# Patient Record
Sex: Female | Born: 1990 | Race: White | Hispanic: Yes | Marital: Single | Smoking: Never smoker
Health system: Southern US, Community
[De-identification: ages and names within clinical notes are randomized; demographics above are authoritative.]

## PROBLEM LIST (undated history)

## (undated) DIAGNOSIS — K5792 Diverticulitis of intestine, part unspecified, without perforation or abscess without bleeding: Secondary | ICD-10-CM

## (undated) DIAGNOSIS — F32A Depression, unspecified: Secondary | ICD-10-CM

## (undated) HISTORY — PX: ADENOIDECTOMY: SUR15

---
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
## 2012-08-14 ENCOUNTER — Encounter (HOSPITAL_BASED_OUTPATIENT_CLINIC_OR_DEPARTMENT_OTHER): Payer: Self-pay | Admitting: Emergency Medicine

## 2012-08-14 ENCOUNTER — Emergency Department (HOSPITAL_BASED_OUTPATIENT_CLINIC_OR_DEPARTMENT_OTHER)
Admission: EM | Admit: 2012-08-14 | Discharge: 2012-08-14 | Disposition: A | Discharge status: Home / Self Care | Payer: Managed Care, Other (non HMO) | Attending: Emergency Medicine | Admitting: Emergency Medicine

## 2012-08-14 DIAGNOSIS — R5381 Other malaise: Secondary | ICD-10-CM | POA: Insufficient documentation

## 2012-08-14 DIAGNOSIS — Z3202 Encounter for pregnancy test, result negative: Secondary | ICD-10-CM | POA: Insufficient documentation

## 2012-08-14 DIAGNOSIS — R112 Nausea with vomiting, unspecified: Secondary | ICD-10-CM | POA: Insufficient documentation

## 2012-08-14 DIAGNOSIS — R197 Diarrhea, unspecified: Secondary | ICD-10-CM | POA: Insufficient documentation

## 2012-08-14 DIAGNOSIS — R109 Unspecified abdominal pain: Secondary | ICD-10-CM | POA: Insufficient documentation

## 2012-08-14 DIAGNOSIS — R42 Dizziness and giddiness: Secondary | ICD-10-CM | POA: Insufficient documentation

## 2012-08-14 DIAGNOSIS — R5383 Other fatigue: Secondary | ICD-10-CM | POA: Insufficient documentation

## 2012-08-14 LAB — URINALYSIS, ROUTINE W REFLEX MICROSCOPIC
Bilirubin Urine: NEGATIVE
Hgb urine dipstick: NEGATIVE
Protein, ur: NEGATIVE mg/dL
Urobilinogen, UA: 0.2 mg/dL (ref 0.0–1.0)

## 2012-08-14 LAB — BASIC METABOLIC PANEL
BUN: 16 mg/dL (ref 6–23)
Calcium: 9.4 mg/dL (ref 8.4–10.5)
GFR calc non Af Amer: >90 mL/min (ref >90)
Glucose, Bld: 139 mg/dL — ABNORMAL HIGH (ref 70–99)
Sodium: 138 mEq/L (ref 135–145)

## 2012-08-14 MED ORDER — SODIUM CHLORIDE 0.9 % IV BOLUS (SEPSIS)
1000.0000 mL | Freq: Once | INTRAVENOUS | Status: AC
  Administered 2012-08-14: 1000 mL via INTRAVENOUS

## 2012-08-14 MED ORDER — SODIUM CHLORIDE 0.9 % IV BOLUS (SEPSIS): 1000.0000 mL | Freq: Once | INTRAVENOUS | Status: DC

## 2012-08-14 MED ORDER — ONDANSETRON HCL 4 MG PO TABS: 8.0000 mg | ORAL_TABLET | Freq: Three times a day (TID) | ORAL | Status: DC | PRN

## 2012-08-14 MED ORDER — LOPERAMIDE HCL 2 MG PO CAPS
4.0000 mg | ORAL_CAPSULE | Freq: Once | ORAL | Status: AC
  Administered 2012-08-14: 4 mg via ORAL
  Filled 2012-08-14: qty 2

## 2012-08-14 MED ORDER — ONDANSETRON HCL 4 MG/2ML IJ SOLN
4.0000 mg | Freq: Once | INTRAMUSCULAR | Status: AC
  Administered 2012-08-14: 4 mg via INTRAVENOUS
  Filled 2012-08-14: qty 2

## 2012-08-14 NOTE — ED Notes (Signed)
Pt with vomiting and diarrhea since 10pm

## 2012-08-14 NOTE — ED Provider Notes (Signed)
History     CSN: [id number]  Arrival date & time 08/14/12  0404   First MD Initiated Contact with Patient 08/14/12 0434      Chief Complaint  Patient presents with  . Emesis  . Diarrhea    (Consider location/radiation/quality/duration/timing/severity/associated sxs/prior treatment) HPI Patient developed vomiting and diarrhea onset 10 PM on 08/13/2012. Accompanied by diffuse crampy mild abdominal pain, nonradiating. Denies blood directed and denies hematemesis denies fever no treatment prior to coming here. Other associated symptoms include lightheadedness. Nothing makes symptoms better or worse History reviewed. No pertinent past medical history. Past medical history negative Past Surgical History  Procedure Date  . Adenoidectomy     No family history on file.  History  Substance Use Topics  . Smoking status: Never Smoker   . Smokeless tobacco: Not on file  . Alcohol Use: No   no drug use  OB History    Grav Para Term Preterm Abortions TAB SAB Ect Mult Living                  Review of Systems  HENT: Negative.   Respiratory: Negative.   Cardiovascular: Negative.   Gastrointestinal: Positive for nausea, vomiting, abdominal pain and diarrhea.  Musculoskeletal: Negative.   Skin: Negative.   Neurological: Positive for weakness.  Hematological: Negative.   Psychiatric/Behavioral: Negative.   All other systems reviewed and are negative.    Allergies  Review of patient's allergies indicates no known allergies.  Home Medications  No current outpatient prescriptions on file.  BP 109/61  Pulse 120  Temp 97.9 F (36.6 C) (Oral)  Resp 18  Ht 4\' 11"  (1.499 m)  Wt 150 lb (68.04 kg)  BMI 30.30 kg/m2  SpO2 98%  Physical Exam  Nursing note and vitals reviewed. Constitutional: She appears well-developed and well-nourished. No distress.  HENT:  Head: Normocephalic and atraumatic.       Mucous membranes dry  Eyes: Conjunctivae normal are normal. Pupils are  equal, round, and reactive to light.  Neck: Neck supple. No tracheal deviation present. No thyromegaly present.  Cardiovascular: Regular rhythm.   No murmur heard.      Mildly tachycardic  Pulmonary/Chest: Effort normal and breath sounds normal.  Abdominal: Soft. Bowel sounds are normal. She exhibits no distension. There is no tenderness.  Musculoskeletal: Normal range of motion. She exhibits no edema and no tenderness.  Neurological: She is alert. Coordination normal.  Skin: Skin is warm and dry. No rash noted.  Psychiatric: She has a normal mood and affect.    ED Course  Procedures (including critical care time)  Labs Reviewed - No data to display No results found.   No diagnosis found. . Results for orders placed during the hospital encounter of 08/14/12  BASIC METABOLIC PANEL      Component Value Range   Sodium 138  135 - 145 mEq/L   Potassium 4.1  3.5 - 5.1 mEq/L   Chloride 101  96 - 112 mEq/L   CO2 24  19 - 32 mEq/L   Glucose, Bld 139 (*) 70 - 99 mg/dL   BUN 16  6 - 23 mg/dL   Creatinine, Ser 4.09  0.50 - 1.10 mg/dL   Calcium 9.4  8.4 - 81.1 mg/dL   GFR calc non Af Amer >90  >90 mL/min   GFR calc Af Amer >90  >90 mL/min  URINALYSIS, ROUTINE W REFLEX MICROSCOPIC      Component Value Range   Color, Urine YELLOW  YELLOW  APPearance CLEAR  CLEAR   Specific Gravity, Urine 1.030  1.005 - 1.030   pH 5.5  5.0 - 8.0   Glucose, UA NEGATIVE  NEGATIVE mg/dL   Hgb urine dipstick NEGATIVE  NEGATIVE   Bilirubin Urine NEGATIVE  NEGATIVE   Ketones, ur 40 (*) NEGATIVE mg/dL   Protein, ur NEGATIVE  NEGATIVE mg/dL   Urobilinogen, UA 0.2  0.0 - 1.0 mg/dL   Nitrite NEGATIVE  NEGATIVE   Leukocytes, UA NEGATIVE  NEGATIVE  PREGNANCY, URINE      Component Value Range   Preg Test, Ur NEGATIVE  NEGATIVE   No results found.  5:23 AM feels improved after treatment with intravenous fluids and Zofran. Drinking water presently. 6: 40 5 AM feels improved radial home.  MDM  Strongly  suspect gastroenteritis Plan prescription for Zofran, Imodium, followup with PMD Suggests hemoglobin A1c Diagnosis #1 nausea vomiting diarrhea #2 hyperglycemia #3 mild dehydration      Doug Sou, MD 08/14/12 [phone]

## 2012-08-14 NOTE — ED Notes (Signed)
Pt still unable to provide urine sample. Pt given water per request.

## 2021-03-23 ENCOUNTER — Encounter (HOSPITAL_BASED_OUTPATIENT_CLINIC_OR_DEPARTMENT_OTHER): Payer: Self-pay | Admitting: *Deleted

## 2021-03-23 ENCOUNTER — Other Ambulatory Visit: Payer: Self-pay

## 2021-03-23 ENCOUNTER — Emergency Department (HOSPITAL_BASED_OUTPATIENT_CLINIC_OR_DEPARTMENT_OTHER): Payer: Self-pay

## 2021-03-23 ENCOUNTER — Emergency Department (HOSPITAL_BASED_OUTPATIENT_CLINIC_OR_DEPARTMENT_OTHER)
Admission: EM | Admit: 2021-03-23 | Discharge: 2021-03-23 | Disposition: A | Discharge status: Home / Self Care | Payer: Self-pay | Attending: Emergency Medicine | Admitting: Emergency Medicine

## 2021-03-23 DIAGNOSIS — R000 Tachycardia, unspecified: Secondary | ICD-10-CM | POA: Insufficient documentation

## 2021-03-23 DIAGNOSIS — K5792 Diverticulitis of intestine, part unspecified, without perforation or abscess without bleeding: Secondary | ICD-10-CM | POA: Insufficient documentation

## 2021-03-23 DIAGNOSIS — B9689 Other specified bacterial agents as the cause of diseases classified elsewhere: Secondary | ICD-10-CM | POA: Insufficient documentation

## 2021-03-23 DIAGNOSIS — U071 COVID-19: Secondary | ICD-10-CM | POA: Insufficient documentation

## 2021-03-23 HISTORY — DX: Depression, unspecified: F32.A

## 2021-03-23 LAB — COMPREHENSIVE METABOLIC PANEL
ALT: 106 U/L — ABNORMAL HIGH (ref 0–44)
AST: 49 U/L — ABNORMAL HIGH (ref 15–41)
Albumin: 3.7 g/dL (ref 3.5–5.0)
Alkaline Phosphatase: 70 U/L (ref 38–126)
Anion gap: 9 (ref 5–15)
BUN: 7 mg/dL (ref 6–20)
CO2: 26 mmol/L (ref 22–32)
Calcium: 8.8 mg/dL — ABNORMAL LOW (ref 8.9–10.3)
Chloride: 100 mmol/L (ref 98–111)
Creatinine, Ser: 0.73 mg/dL (ref 0.44–1.00)
GFR, Estimated: >60 mL/min (ref >60)
Glucose, Bld: 103 mg/dL — ABNORMAL HIGH (ref 70–99)
Potassium: 3.6 mmol/L (ref 3.5–5.1)
Sodium: 135 mmol/L (ref 135–145)
Total Bilirubin: 0.7 mg/dL (ref 0.3–1.2)
Total Protein: 7.4 g/dL (ref 6.5–8.1)

## 2021-03-23 LAB — URINALYSIS, ROUTINE W REFLEX MICROSCOPIC
Bilirubin Urine: NEGATIVE
Glucose, UA: NEGATIVE mg/dL
Ketones, ur: 15 mg/dL — AB
Leukocytes,Ua: NEGATIVE
Nitrite: NEGATIVE
Protein, ur: NEGATIVE mg/dL
Specific Gravity, Urine: 1.01 (ref 1.005–1.030)
pH: 6.5 (ref 5.0–8.0)

## 2021-03-23 LAB — CBC WITH DIFFERENTIAL/PLATELET
Abs Immature Granulocytes: 0.05 10*3/uL (ref 0.00–0.07)
Basophils Absolute: 0 10*3/uL (ref 0.0–0.1)
Basophils Relative: 0 %
Eosinophils Absolute: 0 10*3/uL (ref 0.0–0.5)
Eosinophils Relative: 0 %
HCT: 37.6 % (ref 36.0–46.0)
Hemoglobin: 12.9 g/dL (ref 12.0–15.0)
Immature Granulocytes: 0 %
Lymphocytes Relative: 10 %
Lymphs Abs: 1.2 10*3/uL (ref 0.7–4.0)
MCH: 29.7 pg (ref 26.0–34.0)
MCHC: 34.3 g/dL (ref 30.0–36.0)
MCV: 86.4 fL (ref 80.0–100.0)
Monocytes Absolute: 0.6 10*3/uL (ref 0.1–1.0)
Monocytes Relative: 5 %
Neutro Abs: 10.5 10*3/uL — ABNORMAL HIGH (ref 1.7–7.7)
Neutrophils Relative %: 85 %
Platelets: 246 10*3/uL (ref 150–400)
RBC: 4.35 MIL/uL (ref 3.87–5.11)
RDW: 11.7 % (ref 11.5–15.5)
WBC: 12.5 10*3/uL — ABNORMAL HIGH (ref 4.0–10.5)
nRBC: 0 % (ref 0.0–0.2)

## 2021-03-23 LAB — URINALYSIS, MICROSCOPIC (REFLEX)

## 2021-03-23 LAB — LIPASE, BLOOD: Lipase: 23 U/L (ref 11–51)

## 2021-03-23 LAB — D-DIMER, QUANTITATIVE: D-Dimer, Quant: 0.76 ug/mL-FEU — ABNORMAL HIGH (ref 0.00–0.50)

## 2021-03-23 LAB — PREGNANCY, URINE: Preg Test, Ur: NEGATIVE

## 2021-03-23 MED ORDER — CIPROFLOXACIN HCL 500 MG PO TABS: 500.0000 mg | ORAL_TABLET | Freq: Two times a day (BID) | ORAL | 0 refills | Status: AC

## 2021-03-23 MED ORDER — IOHEXOL 350 MG/ML SOLN
100.0000 mL | Freq: Once | INTRAVENOUS | Status: AC | PRN
  Administered 2021-03-23: 100 mL via INTRAVENOUS

## 2021-03-23 MED ORDER — METRONIDAZOLE 500 MG PO TABS
500.0000 mg | ORAL_TABLET | Freq: Once | ORAL | Status: AC
  Administered 2021-03-23: 500 mg via ORAL
  Filled 2021-03-23: qty 1

## 2021-03-23 MED ORDER — FENTANYL CITRATE (PF) 100 MCG/2ML IJ SOLN
50.0000 ug | Freq: Once | INTRAMUSCULAR | Status: DC
  Filled 2021-03-23: qty 2

## 2021-03-23 MED ORDER — ONDANSETRON HCL 4 MG/2ML IJ SOLN
4.0000 mg | Freq: Once | INTRAMUSCULAR | Status: AC
  Administered 2021-03-23: 4 mg via INTRAVENOUS
  Filled 2021-03-23: qty 2

## 2021-03-23 MED ORDER — KETOROLAC TROMETHAMINE 15 MG/ML IJ SOLN
15.0000 mg | Freq: Once | INTRAMUSCULAR | Status: AC
  Administered 2021-03-23: 15 mg via INTRAVENOUS
  Filled 2021-03-23: qty 1

## 2021-03-23 MED ORDER — CIPROFLOXACIN HCL 500 MG PO TABS
500.0000 mg | ORAL_TABLET | Freq: Once | ORAL | Status: AC
  Administered 2021-03-23: 500 mg via ORAL
  Filled 2021-03-23: qty 1

## 2021-03-23 MED ORDER — SODIUM CHLORIDE 0.9 % IV BOLUS
500.0000 mL | Freq: Once | INTRAVENOUS | Status: AC
  Administered 2021-03-23: 500 mL via INTRAVENOUS

## 2021-03-23 MED ORDER — METRONIDAZOLE 500 MG PO TABS: 500.0000 mg | ORAL_TABLET | Freq: Three times a day (TID) | ORAL | 0 refills | Status: AC

## 2021-03-23 NOTE — ED Triage Notes (Signed)
Covid + x 9 days , left flank pain x 2 days

## 2021-03-23 NOTE — ED Provider Notes (Signed)
[facility] EMERGENCY DEPARTMENT Provider Note   CSN: [id number] Arrival date & time: 03/23/21  0035     History Chief Complaint  Patient presents with   Covid Positive    Wanda Braun is a 30 y.o. female.  The history is provided by the patient.  Wanda Braun is a 30 y.o. female who presents to the Emergency Department complaining of abdominal pain.  She presents to the ED complaining of LUQ abdominal pain that started two days ago.  Pain is sharp in nature, waxes and wanes.  Thought she was constipated so she took magnesium with resultant diarrhea but no improvement in pain.  Pain is worse with breathing and walking.    No fever, sob.  Has mild cough.  Has mild nausea.  No vomiting.  No dysuria.    Had covid 19 a week ago Tuesday.    Has no known medical problems and takes no medications.  No prior similar sxs.      Past Medical History:  Diagnosis Date   Depression     There are no problems to display for this patient.   Past Surgical History:  Procedure Laterality Date   ADENOIDECTOMY       OB History   No obstetric history on file.     No family history on file.  Social History   Tobacco Use   Smoking status: Never  Substance Use Topics   Alcohol use: No   Drug use: No    Home Medications Prior to Admission medications   Medication Sig Start Date End Date Taking? Authorizing Provider  ciprofloxacin (CIPRO) 500 MG tablet Take 1 tablet (500 mg total) by mouth 2 (two) times daily. 03/23/21  Yes Tilden Fossa, MD  metroNIDAZOLE (FLAGYL) 500 MG tablet Take 1 tablet (500 mg total) by mouth 3 (three) times daily. 03/23/21  Yes Tilden Fossa, MD  ondansetron (ZOFRAN) 4 MG tablet Take 2 tablets (8 mg total) by mouth every 8 (eight) hours as needed for nausea. 08/14/12   Doug Sou, MD    Allergies    Fish allergy, Shellfish allergy, and Penicillins  Review of Systems   Review of Systems  All other systems reviewed and are  negative.  Physical Exam Updated Vital Signs BP 122/88 (BP Location: Left Arm)   Pulse 98   Temp 99.2 F (37.3 C) (Oral)   Resp 16   Ht 4\' 11"  (1.499 m)   Wt 86.2 kg   LMP 03/15/2021   SpO2 97%   BMI 38.38 kg/m   Physical Exam Vitals and nursing note reviewed.  Constitutional:      Appearance: She is well-developed.  HENT:     Head: Normocephalic and atraumatic.  Cardiovascular:     Rate and Rhythm: Regular rhythm. Tachycardia present.     Heart sounds: No murmur heard. Pulmonary:     Effort: Pulmonary effort is normal. No respiratory distress.     Breath sounds: Normal breath sounds.  Abdominal:     Palpations: Abdomen is soft.     Tenderness: There is abdominal tenderness. There is no guarding or rebound.     Comments: Moderate LUQ tenderness  Musculoskeletal:        General: No swelling or tenderness.  Skin:    General: Skin is warm and dry.  Neurological:     Mental Status: She is alert and oriented to person, place, and time.  Psychiatric:        Behavior: Behavior normal.  ED Results / Procedures / Treatments   Labs (all labs ordered are listed, but only abnormal results are displayed) Labs Reviewed  URINALYSIS, ROUTINE W REFLEX MICROSCOPIC - Abnormal; Notable for the following components:      Result Value   Hgb urine dipstick TRACE (*)    Ketones, ur 15 (*)    All other components within normal limits  URINALYSIS, MICROSCOPIC (REFLEX) - Abnormal; Notable for the following components:   Bacteria, UA RARE (*)    All other components within normal limits  COMPREHENSIVE METABOLIC PANEL - Abnormal; Notable for the following components:   Glucose, Bld 103 (*)    Calcium 8.8 (*)    AST 49 (*)    ALT 106 (*)    All other components within normal limits  CBC WITH DIFFERENTIAL/PLATELET - Abnormal; Notable for the following components:   WBC 12.5 (*)    Neutro Abs 10.5 (*)    All other components within normal limits  D-DIMER, QUANTITATIVE - Abnormal;  Notable for the following components:   D-Dimer, Quant 0.76 (*)    All other components within normal limits  PREGNANCY, URINE  LIPASE, BLOOD    EKG None  Radiology CT Angio Chest PE W/Cm &/Or Wo Cm  Result Date: 03/23/2021 CLINICAL DATA:  Positive D-dimer and COVID positive 9 days ago. Left flank pain for 2 days EXAM: CT ANGIOGRAPHY CHEST CT ABDOMEN AND PELVIS WITH CONTRAST TECHNIQUE: Multidetector CT imaging of the chest was performed using the standard protocol during bolus administration of intravenous contrast. Multiplanar CT image reconstructions and MIPs were obtained to evaluate the vascular anatomy. Multidetector CT imaging of the abdomen and pelvis was performed using the standard protocol during bolus administration of intravenous contrast. CONTRAST:  OMNIPAQUE IOHEXOL 350 MG/ML SOLN COMPARISON:  None. FINDINGS: CTA CHEST FINDINGS Cardiovascular: Satisfactory opacification of the pulmonary arteries to the segmental level. No evidence of pulmonary embolism. Thin section reformats are available. Normal heart size. No pericardial effusion. Mediastinum/Nodes: Negative for adenopathy or mass. Lungs/Pleura: Mild mosaic attenuation of the lungs, usually from air trapping. There is no edema, consolidation, effusion, or pneumothorax. Musculoskeletal: Negative Review of the MIP images confirms the above findings. CT ABDOMEN and PELVIS FINDINGS Hepatobiliary: Multiple hypervascular liver masses on chest CTA which are very subtle on portal venous phase. These are marked on series 4 of the chest CTA. Two masses in the left lobe liver, the larger measuring 3 cm on 4:62. On the same series image 66, 4 lesions are highlighted and measure up to 19 mm. An even smaller inferior and posterior right lobe lesion is seen on 4:87. No signs of cirrhosis or reported history of malignancy. No evidence of biliary obstruction or stone. Pancreas: Unremarkable. Spleen: Unremarkable. Adrenals/Urinary Tract: Negative  adrenals. No hydronephrosis or stone. Unremarkable bladder. Stomach/Bowel: Focal colonic wall thickening with adjacent fat inflammation at the level of a thickened diverticulum of the descending segment. Colonic diverticulosis is moderate at the level of the descending segment when considering age. Vascular/Lymphatic: No acute vascular abnormality. No mass or adenopathy. Reproductive:Negative Other: No ascites or pneumoperitoneum. Musculoskeletal: No acute abnormalities. Review of the MIP images confirms the above findings. IMPRESSION: Chest CTA: No acute finding. Abdominal CT: 1. Descending colonic diverticulitis. No abscess or pneumoperitoneum. 2. 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 Recommend outpatient enhanced liver MRI. Electronically Signed   By: Marnee Spring M.D.   On: 03/23/2021 04:51   CT Abdomen Pelvis W Contrast  Result Date: 03/23/2021 CLINICAL DATA:  Positive D-dimer and COVID positive 9 days ago. Left flank pain for 2 days EXAM: CT ANGIOGRAPHY CHEST CT ABDOMEN AND PELVIS WITH CONTRAST TECHNIQUE: Multidetector CT imaging of the chest was performed using the standard protocol during bolus administration of intravenous contrast. Multiplanar CT image reconstructions and MIPs were obtained to evaluate the vascular anatomy. Multidetector CT imaging of the abdomen and pelvis was performed using the standard protocol during bolus administration of intravenous contrast. CONTRAST:  100mL OMNIPAQUE IOHEXOL 350 MG/ML SOLN COMPARISON:  None. FINDINGS: CTA CHEST FINDINGS Cardiovascular: Satisfactory opacification of the pulmonary arteries to the segmental level. No evidence of pulmonary embolism. Thin section reformats are available. Normal heart size. No pericardial effusion. Mediastinum/Nodes: Negative for adenopathy or mass. Lungs/Pleura: Mild mosaic attenuation of the lungs, usually from air trapping. There is no edema, consolidation, effusion, or  pneumothorax. Musculoskeletal: Negative Review of the MIP images confirms the above findings. CT ABDOMEN and PELVIS FINDINGS Hepatobiliary: Multiple hypervascular liver masses on chest CTA which are very subtle on portal venous phase. These are marked on series 4 of the chest CTA. Two masses in the left lobe liver, the larger measuring 3 cm on 4:62. On the same series image 66, 4 lesions are highlighted and measure up to 19 mm. An even smaller inferior and posterior right lobe lesion is seen on 4:87. No signs of cirrhosis or reported history of malignancy. No evidence of biliary obstruction or stone. Pancreas: Unremarkable. Spleen: Unremarkable. Adrenals/Urinary Tract: Negative adrenals. No hydronephrosis or stone. Unremarkable bladder. Stomach/Bowel: Focal colonic wall thickening with adjacent fat inflammation at the level of a thickened diverticulum of the descending segment. Colonic diverticulosis is moderate at the level of the descending segment when considering age. Vascular/Lymphatic: No acute vascular abnormality. No mass or adenopathy. Reproductive:Negative Other: No ascites or pneumoperitoneum. Musculoskeletal: No acute abnormalities. Review of the MIP images confirms the above findings. IMPRESSION: Chest CTA: No acute finding. Abdominal CT: 1. Descending colonic diverticulitis. No abscess or pneumoperitoneum. 2. 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 Recommend outpatient enhanced liver MRI. Electronically Signed   By: Marnee SpringJonathon  Watts M.D.   On: 03/23/2021 04:51    Procedures Procedures   Medications Ordered in ED Medications  fentaNYL (SUBLIMAZE) injection 50 mcg (50 mcg Intravenous Not Given 03/23/21 0259)  ciprofloxacin (CIPRO) tablet 500 mg (has no administration in time range)  metroNIDAZOLE (FLAGYL) tablet 500 mg (has no administration in time range)  ketorolac (TORADOL) 15 MG/ML injection 15 mg (has no administration in time range)   ondansetron (ZOFRAN) injection 4 mg (4 mg Intravenous Given 03/23/21 0303)  sodium chloride 0.9 % bolus 500 mL ( Intravenous Stopped 03/23/21 0404)  iohexol (OMNIPAQUE) 350 MG/ML injection 100 mL (100 mLs Intravenous Contrast Given 03/23/21 0410)    ED Course  I have reviewed the triage vital signs and the nursing notes.  Pertinent labs & imaging results that were available during my care of the patient were reviewed by me and considered in my medical decision making (see chart for details).    MDM Rules/Calculators/A&P                          here for evaluation of two days of left upper quadrant pain, diarrhea. She did recently have a COVID-19 infection. She is tachycardic on ED presentation with significant abdominal tenderness. There is a pleuritic component to her pain and a D dimer was  obtained. D dimer was elevated and a CT PE study as well as a CT abdomen pelvis were obtained. Imaging is significant for acute diverticulitis without evidence of complication. Discussed with patient findings of studies. Will start antibiotics. She has a penicillin allergy, therefore we will treat with ciprofloxacin and Flagyl. Discussed outpatient follow-up and return precautions.  Final Clinical Impression(s) / ED Diagnoses Final diagnoses:  Acute diverticulitis    Rx / DC Orders ED Discharge Orders          Ordered    ciprofloxacin (CIPRO) 500 MG tablet  2 times daily        03/23/21 0500    metroNIDAZOLE (FLAGYL) 500 MG tablet  3 times daily        03/23/21 0500             Tilden Fossa, MD 03/23/21 [phone]

## 2022-02-08 ENCOUNTER — Emergency Department (HOSPITAL_BASED_OUTPATIENT_CLINIC_OR_DEPARTMENT_OTHER): Admission: EM | Admit: 2022-02-08 | Discharge: 2022-02-08 | Discharge status: Against Medical Advice | Payer: Self-pay

## 2022-02-08 ENCOUNTER — Other Ambulatory Visit: Payer: Self-pay

## 2022-10-19 ENCOUNTER — Other Ambulatory Visit: Payer: Self-pay

## 2022-10-19 ENCOUNTER — Emergency Department (HOSPITAL_BASED_OUTPATIENT_CLINIC_OR_DEPARTMENT_OTHER): Payer: No Typology Code available for payment source

## 2022-10-19 ENCOUNTER — Encounter (HOSPITAL_BASED_OUTPATIENT_CLINIC_OR_DEPARTMENT_OTHER): Payer: Self-pay

## 2022-10-19 ENCOUNTER — Emergency Department (HOSPITAL_BASED_OUTPATIENT_CLINIC_OR_DEPARTMENT_OTHER)
Admission: EM | Admit: 2022-10-19 | Discharge: 2022-10-19 | Disposition: A | Discharge status: Home / Self Care | Payer: No Typology Code available for payment source | Attending: Emergency Medicine | Admitting: Emergency Medicine

## 2022-10-19 DIAGNOSIS — K5732 Diverticulitis of large intestine without perforation or abscess without bleeding: Secondary | ICD-10-CM | POA: Insufficient documentation

## 2022-10-19 DIAGNOSIS — R000 Tachycardia, unspecified: Secondary | ICD-10-CM | POA: Insufficient documentation

## 2022-10-19 DIAGNOSIS — D72829 Elevated white blood cell count, unspecified: Secondary | ICD-10-CM | POA: Insufficient documentation

## 2022-10-19 DIAGNOSIS — R109 Unspecified abdominal pain: Secondary | ICD-10-CM | POA: Diagnosis present

## 2022-10-19 HISTORY — DX: Diverticulitis of intestine, part unspecified, without perforation or abscess without bleeding: K57.92

## 2022-10-19 LAB — CBC
HCT: 39.6 % (ref 36.0–46.0)
Hemoglobin: 13.5 g/dL (ref 12.0–15.0)
MCH: 29.5 pg (ref 26.0–34.0)
MCHC: 34.1 g/dL (ref 30.0–36.0)
MCV: 86.5 fL (ref 80.0–100.0)
Platelets: 338 10*3/uL (ref 150–400)
RBC: 4.58 MIL/uL (ref 3.87–5.11)
RDW: 11.9 % (ref 11.5–15.5)
WBC: 11.6 10*3/uL — ABNORMAL HIGH (ref 4.0–10.5)
nRBC: 0 % (ref 0.0–0.2)

## 2022-10-19 LAB — COMPREHENSIVE METABOLIC PANEL
ALT: 33 U/L (ref 0–44)
AST: 20 U/L (ref 15–41)
Albumin: 4.1 g/dL (ref 3.5–5.0)
Alkaline Phosphatase: 75 U/L (ref 38–126)
Anion gap: 10 (ref 5–15)
BUN: 9 mg/dL (ref 6–20)
CO2: 27 mmol/L (ref 22–32)
Calcium: 9.6 mg/dL (ref 8.9–10.3)
Chloride: 97 mmol/L — ABNORMAL LOW (ref 98–111)
Creatinine, Ser: 0.72 mg/dL (ref 0.44–1.00)
GFR, Estimated: >60 mL/min (ref >60)
Glucose, Bld: 98 mg/dL (ref 70–99)
Potassium: 3.9 mmol/L (ref 3.5–5.1)
Sodium: 134 mmol/L — ABNORMAL LOW (ref 135–145)
Total Bilirubin: 1.3 mg/dL — ABNORMAL HIGH (ref 0.3–1.2)
Total Protein: 8.3 g/dL — ABNORMAL HIGH (ref 6.5–8.1)

## 2022-10-19 LAB — URINALYSIS, ROUTINE W REFLEX MICROSCOPIC
Bilirubin Urine: NEGATIVE
Glucose, UA: NEGATIVE mg/dL
Ketones, ur: NEGATIVE mg/dL
Leukocytes,Ua: NEGATIVE
Nitrite: NEGATIVE
Protein, ur: NEGATIVE mg/dL
Specific Gravity, Urine: <=1.005 (ref 1.005–1.030)
pH: 6.5 (ref 5.0–8.0)

## 2022-10-19 LAB — PREGNANCY, URINE: Preg Test, Ur: NEGATIVE

## 2022-10-19 LAB — URINALYSIS, MICROSCOPIC (REFLEX)

## 2022-10-19 LAB — LIPASE, BLOOD: Lipase: 23 U/L (ref 11–51)

## 2022-10-19 MED ORDER — CIPROFLOXACIN HCL 500 MG PO TABS: 500.0000 mg | ORAL_TABLET | Freq: Two times a day (BID) | ORAL | 0 refills | Status: AC

## 2022-10-19 MED ORDER — METRONIDAZOLE 500 MG PO TABS: 500.0000 mg | ORAL_TABLET | Freq: Four times a day (QID) | ORAL | 0 refills | Status: AC

## 2022-10-19 MED ORDER — METRONIDAZOLE 500 MG PO TABS
500.0000 mg | ORAL_TABLET | Freq: Once | ORAL | Status: AC
  Administered 2022-10-19: 500 mg via ORAL
  Filled 2022-10-19: qty 1

## 2022-10-19 MED ORDER — CIPROFLOXACIN HCL 500 MG PO TABS
500.0000 mg | ORAL_TABLET | Freq: Once | ORAL | Status: AC
  Administered 2022-10-19: 500 mg via ORAL
  Filled 2022-10-19: qty 1

## 2022-10-19 NOTE — Discharge Instructions (Addendum)
You were seen in the ER for abdominal pain and diagnosed with acute sigmoid diverticulitis. Thankfully this appears to be well isolated and uncomplicated. A dose of antibiotic for this was given in the ER prior to discharge. A prescription for 5 days of two antibiotics were sent to your pharmacy as well which you should take in its entirety.  Come back into the ER if you develop severe nausea/vomiting, a high fever, or do not improve with the antibiotic therapy that you have been prescribed.  I have also attached information for [facility] as you did not have a primary care provider noted on your chart. If you have a primary care provider in place, please plan to follow up with them in the next week or so.

## 2022-10-19 NOTE — ED Notes (Signed)
Discharge paperwork reviewed entirely with patient, including Rx's and follow up care. Pain was under control. Pt verbalized understanding as well as all parties involved. No questions or concerns voiced at the time of discharge. No acute distress noted.   Pt ambulated out to PVA without incident or assistance.  

## 2022-10-19 NOTE — ED Triage Notes (Addendum)
Pt presents with complaint of sharp LLQ abdominal pain and pelvic pain . Pt with  constipation x 1 day. Hx of diverticulitis and concerned it may be a flare up

## 2022-10-19 NOTE — ED Provider Notes (Signed)
[facility] Provider Note   CSN: [id number] Arrival date & time: 10/19/22  1406     History Chief Complaint  Patient presents with   Abdominal Pain    Wanda Braun is a 32 y.o. female.  Patient with past history significant for recent viral URI, bacterial conjunctivitis of the left eye presents to the emergency department for complaints of abdominal pain.  She reports has been experiencing significant abdominal pain for the last 2 days on her left side.  She reports that this pain is somewhat consistent and she is concerned as she has a prior history of diverticulitis/diverticulosis.  Patient reports that she typically will be placed on antibiotics if she has diverticulitis.  Patient denies any fevers at this time but feels that she is warm and sweating.  Patient denies any significant nausea, vomiting, diarrhea.  Does report that she has a lack of appetite due to the significant abdominal pain.  Patient is currently being treated for the bacterial conjunctivitis with antibiotic drops.  At this time patient does not believe that she could be pregnant.  She reports that she has some lower abdominal cramping when she tries to urinate but denies any obvious dysuria, hematuria, increased urinary frequency or urgency.   Abdominal Pain Associated symptoms: constipation        Home Medications Prior to Admission medications   Medication Sig Start Date End Date Taking? Authorizing Provider  ciprofloxacin (CIPRO) 500 MG tablet Take 1 tablet (500 mg total) by mouth every 12 (twelve) hours. 10/19/22  Yes Luvenia Heller, PA-C  metroNIDAZOLE (FLAGYL) 500 MG tablet Take 1 tablet (500 mg total) by mouth 4 (four) times daily. 10/19/22  Yes Luvenia Heller, PA-C  ciprofloxacin (CIPRO) 500 MG tablet Take 1 tablet (500 mg total) by mouth 2 (two) times daily. 03/23/21   Quintella Reichert, MD  metroNIDAZOLE (FLAGYL) 500 MG tablet Take 1 tablet (500 mg total) by mouth 3  (three) times daily. 03/23/21   Quintella Reichert, MD  ondansetron (ZOFRAN) 4 MG tablet Take 2 tablets (8 mg total) by mouth every 8 (eight) hours as needed for nausea. 08/14/12   Orlie Dakin, MD      Allergies    Fish allergy, Shellfish allergy, and Penicillins    Review of Systems   Review of Systems  Gastrointestinal:  Positive for abdominal pain and constipation.  All other systems reviewed and are negative.   Physical Exam Updated Vital Signs BP 129/81 (BP Location: Left Arm)   Pulse 97   Temp 98.5 F (36.9 C) (Oral)   Resp 18   Ht '4\' 11"'$  (1.499 m)   Wt 90.7 kg   LMP 09/22/2022   SpO2 99%   BMI 40.40 kg/m  Physical Exam Vitals and nursing note reviewed.  Constitutional:      Appearance: She is well-developed.  HENT:     Head: Normocephalic and atraumatic.  Cardiovascular:     Rate and Rhythm: Regular rhythm. Tachycardia present.     Heart sounds: Normal heart sounds.  Pulmonary:     Effort: Pulmonary effort is normal.     Breath sounds: Normal breath sounds. No wheezing.  Abdominal:     General: Abdomen is flat. Bowel sounds are normal. There is no distension.     Palpations: Abdomen is soft. There is no mass.     Tenderness: There is abdominal tenderness. There is no right CVA tenderness or left CVA tenderness.     Comments:  Left-sided abdominal tenderness not focal to left upper quadrant or left lower quadrant.  Skin:    Capillary Refill: Capillary refill takes less than 2 seconds.  Neurological:     General: No focal deficit present.     Mental Status: She is alert.     ED Results / Procedures / Treatments   Labs (all labs ordered are listed, but only abnormal results are displayed) Labs Reviewed  COMPREHENSIVE METABOLIC PANEL - Abnormal; Notable for the following components:      Result Value   Sodium 134 (*)    Chloride 97 (*)    Total Protein 8.3 (*)    Total Bilirubin 1.3 (*)    All other components within normal limits  CBC - Abnormal;  Notable for the following components:   WBC 11.6 (*)    All other components within normal limits  URINALYSIS, ROUTINE W REFLEX MICROSCOPIC - Abnormal; Notable for the following components:   Hgb urine dipstick TRACE (*)    All other components within normal limits  URINALYSIS, MICROSCOPIC (REFLEX) - Abnormal; Notable for the following components:   Bacteria, UA RARE (*)    All other components within normal limits  LIPASE, BLOOD  PREGNANCY, URINE    EKG None  Radiology CT ABDOMEN PELVIS WO CONTRAST  Result Date: 10/19/2022 CLINICAL DATA:  Sharp left lower quadrant abdominal and pelvic pain. History of diverticulitis. EXAM: CT ABDOMEN AND PELVIS WITHOUT CONTRAST TECHNIQUE: Multidetector CT imaging of the abdomen and pelvis was performed following the standard protocol without IV contrast. RADIATION DOSE REDUCTION: This exam was performed according to the departmental dose-optimization program which includes automated exposure control, adjustment of the mA and/or kV according to patient size and/or use of iterative reconstruction technique. COMPARISON:  CT abdomen pelvis dated August 03, 2022. FINDINGS: Lower chest: No acute abnormality. Hepatobiliary: Multiple faintly hyperdense liver lesions correspond to the hypervascular lesions seen on CT in August 2022. These do not appear significantly changed in size, although evaluation is limited without intravenous contrast. Unchanged mild diffusely decreased liver density. The gallbladder is unremarkable. No biliary dilatation. Pancreas: Unremarkable. No pancreatic ductal dilatation or surrounding inflammatory changes. Spleen: Normal in size without focal abnormality. Adrenals/Urinary Tract: Adrenal glands are unremarkable. Kidneys are normal, without renal calculi, focal lesion, or hydronephrosis. Bladder is unremarkable. Stomach/Bowel: The stomach is within normal limits. Left-sided colonic diverticulosis again noted. New circumferential wall  thickening of the proximal sigmoid colon adjacent to an inflamed diverticulum (series 2, image 59). No extraluminal air or fluid collection. Unremarkable small bowel. Normal appendix. Vascular/Lymphatic: No significant vascular findings are present. No enlarged abdominal or pelvic lymph nodes. Reproductive: Uterus and bilateral adnexa are unremarkable. Other: Trace free fluid in the pelvis.  No pneumoperitoneum. Musculoskeletal: No acute or significant osseous findings. IMPRESSION: 1. Acute uncomplicated sigmoid diverticulitis. 2. Multiple faintly hyperdense liver lesions correspond to the hypervascular lesions seen on CT in August 2022. These do not appear significantly changed in size, although evaluation is limited without intravenous contrast. Follow-up outpatient liver protocol MRI abdomen with and without contrast is recommended for definitive evaluation. 3. Unchanged mild hepatic steatosis. Electronically Signed   By: Titus Dubin M.D.   On: 10/19/2022 16:34   DG Chest 1 View  Result Date: 10/19/2022 CLINICAL DATA:  Shortness of breath. EXAM: CHEST  1 VIEW COMPARISON:  CT chest dated March 23, 2021. FINDINGS: The heart size and mediastinal contours are within normal limits. Both lungs are clear. The visualized skeletal structures are unremarkable. IMPRESSION: No active  disease. Electronically Signed   By: Titus Dubin M.D.   On: 10/19/2022 16:27    Procedures Procedures   Medications Ordered in ED Medications  metroNIDAZOLE (FLAGYL) tablet 500 mg (500 mg Oral Given 10/19/22 1804)  ciprofloxacin (CIPRO) tablet 500 mg (500 mg Oral Given 10/19/22 1804)    ED Course/ Medical Decision Making/ A&P Clinical Course as of 10/19/22 1812  Fri Oct 19, 2022  1751 DG Chest 1 View [OZ]    Clinical Course User Index [OZ] Luvenia Heller, PA-C                           Medical Decision Making Amount and/or Complexity of Data Reviewed Labs: ordered. Radiology: ordered.   This patient presents to  the ED for concern of abdominal pain.  Differential diagnosis includes gastroenteritis, viral URI, diverticulitis, bowel obstruction, constipation   Lab Tests:  I Ordered, and personally interpreted labs.  The pertinent results include: Leukocytosis at 11.6, urinalysis suspicious for possible UTI with some bacteria present and small amount of white blood cells but no obvious nitrites or leukocytes.  CMP largely normal, lipase negative, pregnancy negative   Imaging Studies ordered:  I ordered imaging studies including CT abdomen pelvis I independently visualized and interpreted imaging which showed acute uncomplicated sigmoid diverticulitis I agree with the radiologist interpretation   Medicines ordered and prescription drug management:  I ordered medication including metronidazole, ciprofloxacin for diverticulitis Reevaluation of the patient after these medicines showed that the patient unchanged I have reviewed the patients home medicines and have made adjustments as needed   Problem List / ED Course:  Patient presents to the emergency department complaints of abdominal pain has been present for approximately 2 days.  Patient reports that she has previously had this type of abdominal pain and has been diagnosed with diverticulitis in the past.  She denies any urinary symptoms at this time such as dysuria, hematuria, increased urinary frequency.  Patient also denying any abdominal discomfort resulting in nausea, vomiting.  She reports her bowel moods have been typically regular and daily but has not had a bowel movement in the last 2 days or so.  Patient is also currently being treated for bacterial conjunctivitis with ophthalmic drops.  Being patient, CT of abdomen was performed that showed acute uncomplicated sigmoid diverticulitis which should be managed with antibiotic therapy.  Patient reports that she was previously taking antibiotic therapy and has responded well for acute  diverticulitis.  Given that there is no evidence of any acute surgical condition such as cholecystitis, bowel obstruction, the patient is likely stable for discharge home with outpatient antibiotic therapy.  Advised patient of plan to follow-up with her primary care provider to ensure that symptoms are resolving or return to the emergency department if she does not have improvement in symptoms with the antibiotic therapy or begins experiencing severe abdominal cramping, fevers, sudden change to severe diarrhea or constipation.  Patient was agreeable to treatment plan verbalized understanding all return precautions.  All questions answered prior to patient discharge.  Final Clinical Impression(s) / ED Diagnoses Final diagnoses:  Diverticulitis of sigmoid colon    Rx / DC Orders ED Discharge Orders          Ordered    metroNIDAZOLE (FLAGYL) 500 MG tablet  4 times daily        10/19/22 1802    ciprofloxacin (CIPRO) 500 MG tablet  Every 12 hours  10/19/22 1802              Luvenia Heller, PA-C 10/19/22 1812    Audley Hose, MD 10/24/22 [phone]

## 2023-03-02 IMAGING — CT CT ANGIO CHEST
2 series · 16 of 28 positions shown · IV contrast (Omnipaque)
Comparison: None.

CLINICAL DATA: Positive D-dimer and COVID positive 9 days ago. Left
flank pain for 2 days

EXAM:
CT ANGIOGRAPHY CHEST
CT ABDOMEN AND PELVIS WITH CONTRAST
TECHNIQUE: Multidetector CT imaging of the chest was performed using the
standard protocol during bolus administration of intravenous
contrast. Multiplanar CT image reconstructions and MIPs were
obtained to evaluate the vascular anatomy. Multidetector CT imaging
of the abdomen and pelvis was performed using the standard protocol
during bolus administration of intravenous contrast.
CONTRAST:  100mL OMNIPAQUE IOHEXOL 350 MG/ML SOLN

[Series 1: pe coronal mip · coronal · 0.53mm/px · 3 of 117 slices shown]
[im 47/117  mediastinal]
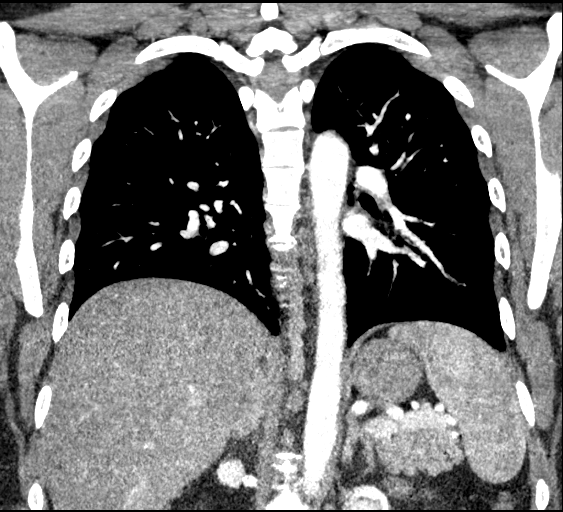
[im 59/117  mediastinal]
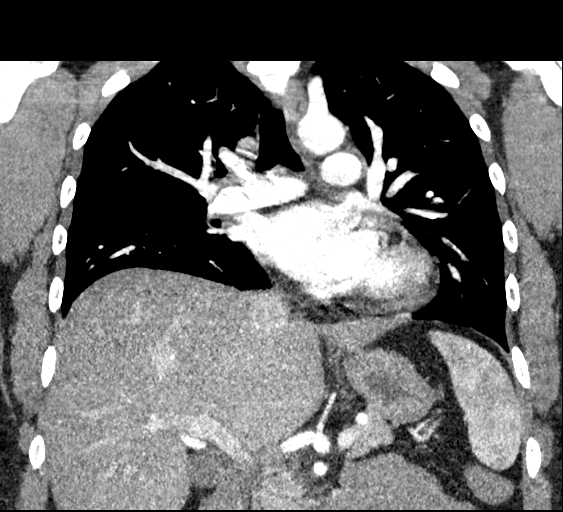
[im 70/117  mediastinal]
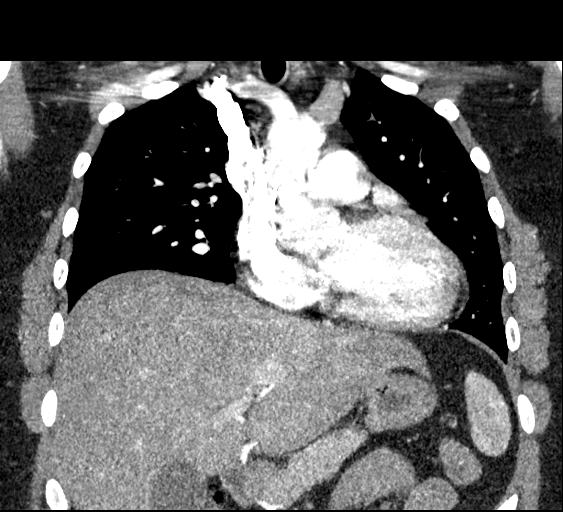

[Series 2: pe sagittal mip · sagittal · 0.49mm/px · 13 of 173 slices shown]
[im 11/173  lung]
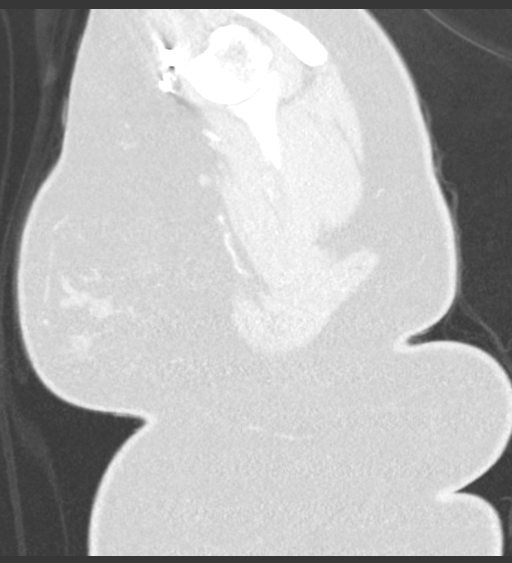
[im 22/173  mediastinal]
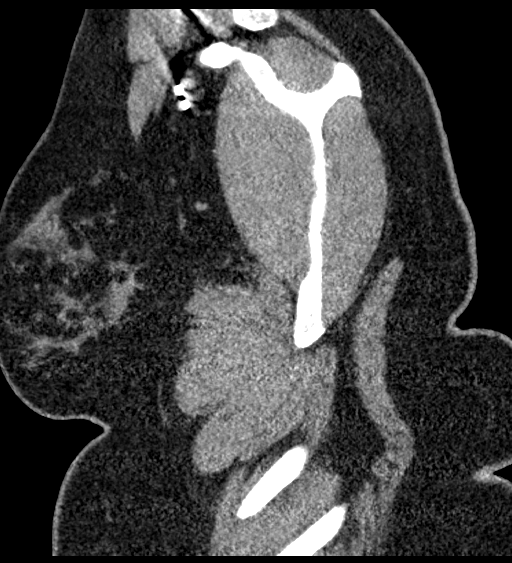
[im 33/173  lung]
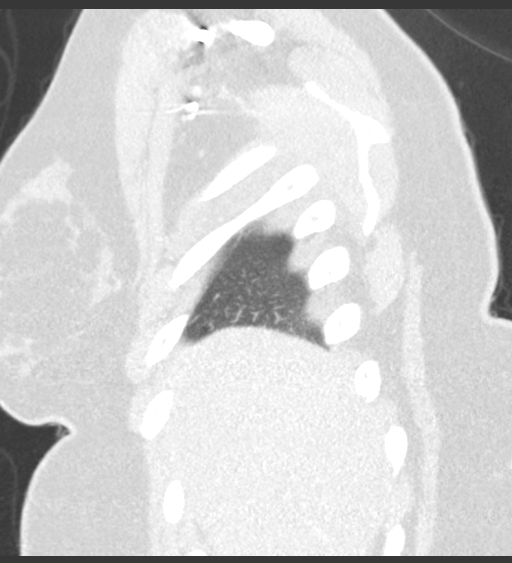
[im 54/173  mediastinal]
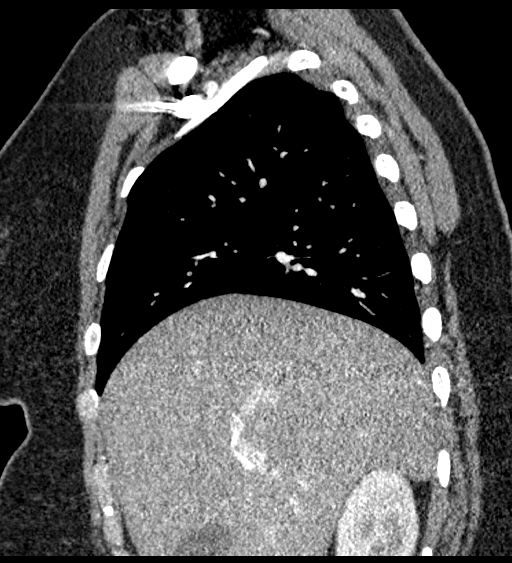
[im 65/173  lung]
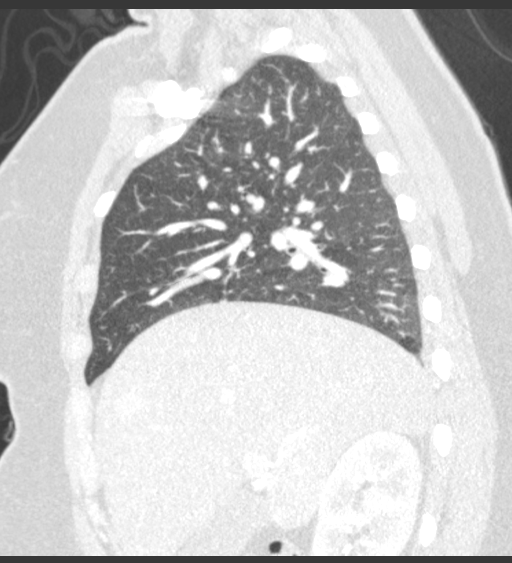
[im 76/173  mediastinal]
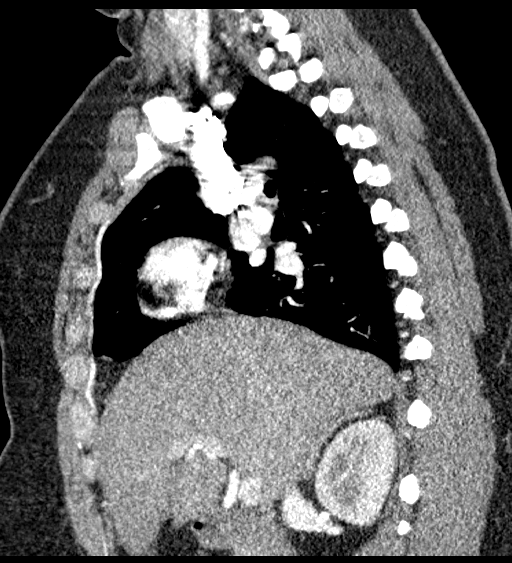
[im 87/173  lung]
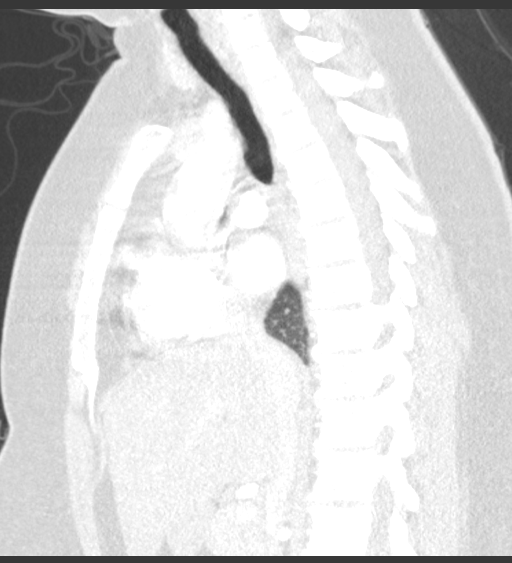
[im 97/173  mediastinal]
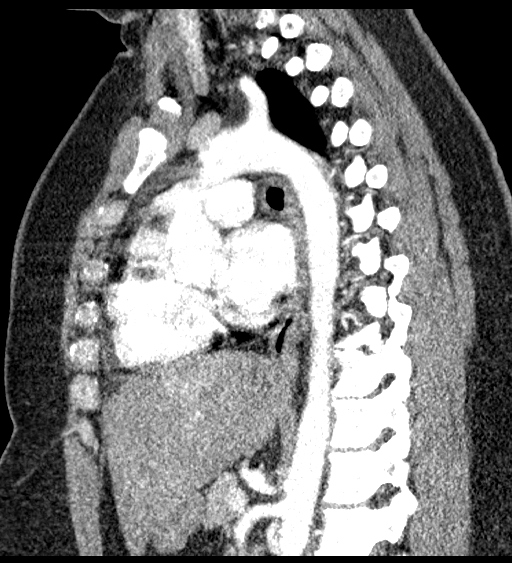
[im 108/173  lung]
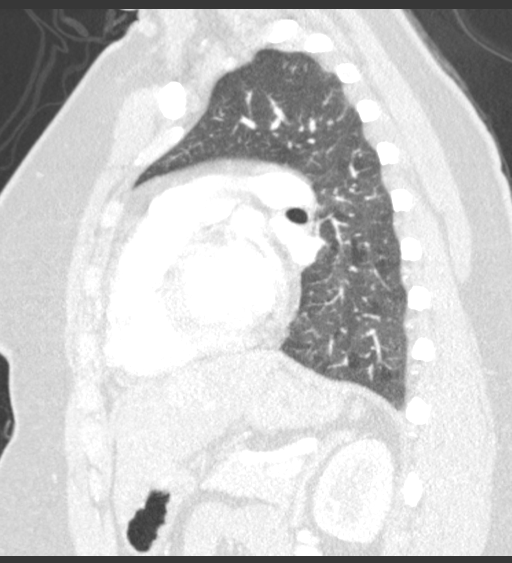
[im 119/173  mediastinal]
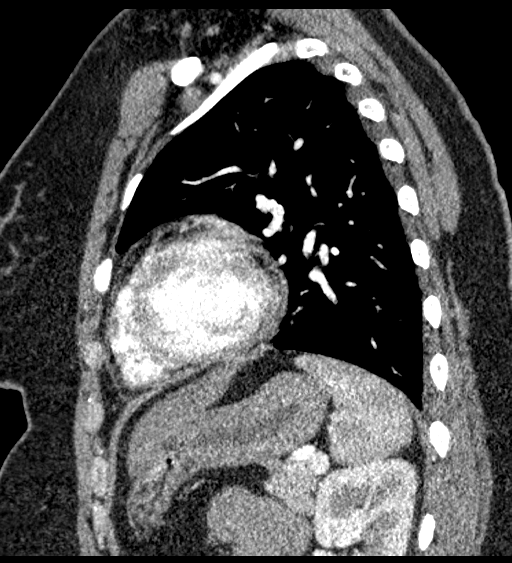
[im 140/173  lung]
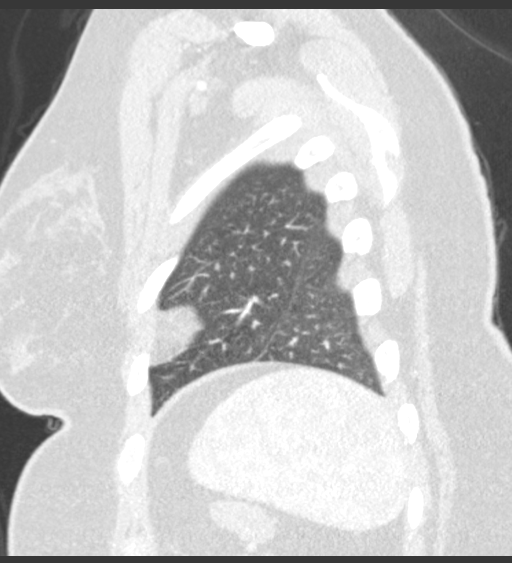
[im 151/173  mediastinal]
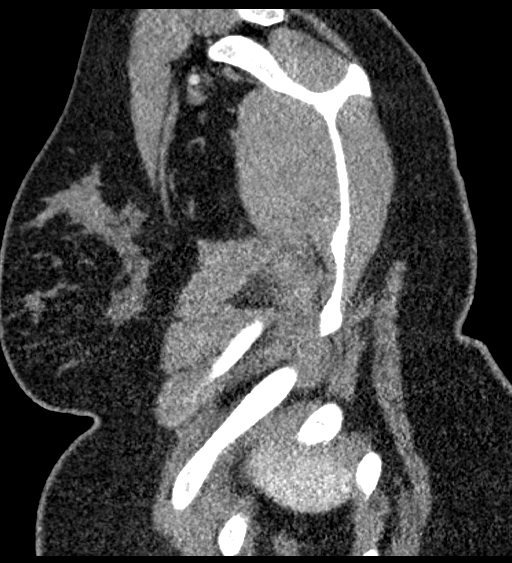
[im 162/173  lung]
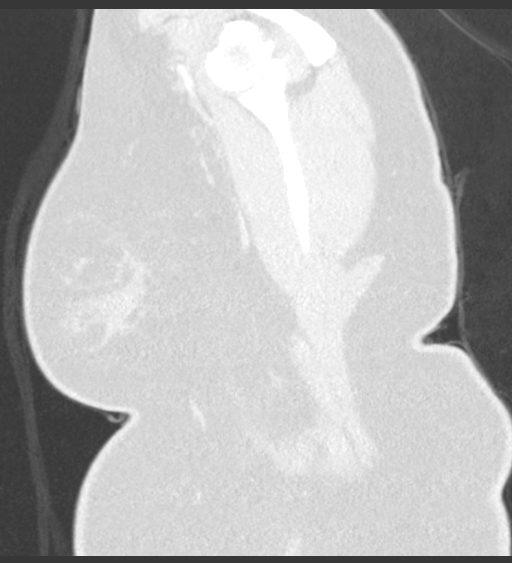

[16 of 28 positions shown; findings below may reference images not displayed]

FINDINGS: CTA CHEST FINDINGS

Cardiovascular: Satisfactory opacification of the pulmonary arteries
to the segmental level. No evidence of pulmonary embolism. Thin
section reformats are available. Normal heart size. No pericardial
effusion.

Mediastinum/Nodes: Negative for adenopathy or mass.

Lungs/Pleura: Mild mosaic attenuation of the lungs, usually from air
trapping. There is no edema, consolidation, effusion, or
pneumothorax.

Musculoskeletal: Negative

Review of the MIP images confirms the above findings.

CT ABDOMEN and PELVIS FINDINGS

Hepatobiliary: Multiple hypervascular liver masses on chest CTA
which are very subtle on portal venous phase. These are marked on
series 4 of the chest CTA. Two masses in the left lobe liver, the
larger measuring 3 cm on [DATE]. On the same series image 66, 4
lesions are highlighted and measure up to 19 mm. An even smaller
inferior and posterior right lobe lesion is seen on [DATE]. No signs
of cirrhosis or reported history of malignancy.

No evidence of biliary obstruction or stone.

Pancreas: Unremarkable.

Spleen: Unremarkable.

Adrenals/Urinary Tract: Negative adrenals. No hydronephrosis or
stone. Unremarkable bladder.

Stomach/Bowel: Focal colonic wall thickening with adjacent fat
inflammation at the level of a thickened diverticulum of the
descending segment. Colonic diverticulosis is moderate at the level
of the descending segment when considering age.

Vascular/Lymphatic: No acute vascular abnormality. No mass or
adenopathy.

Reproductive:Negative

Other: No ascites or pneumoperitoneum.

Musculoskeletal: No acute abnormalities.

Review of the MIP images confirms the above findings.
IMPRESSION: Chest CTA:

No acute finding.

Abdominal CT:

1. Descending colonic diverticulitis. No abscess or
pneumoperitoneum.
2. Multiple hypervascular liver masses, most apparent on the
arterial phase, favor adenoma or FNH. Recommend outpatient enhanced
liver MRI.

## 2023-03-02 IMAGING — CT CT ABD-PELV W/ CM
2 of 4 series · 16 of 46 positions shown, 18 images · IV contrast (Omnipaque)
Comparison: None.

CLINICAL DATA: Positive D-dimer and COVID positive 9 days ago. Left
flank pain for 2 days

EXAM:
CT ANGIOGRAPHY CHEST
CT ABDOMEN AND PELVIS WITH CONTRAST
TECHNIQUE: Multidetector CT imaging of the chest was performed using the
standard protocol during bolus administration of intravenous
contrast. Multiplanar CT image reconstructions and MIPs were
obtained to evaluate the vascular anatomy. Multidetector CT imaging
of the abdomen and pelvis was performed using the standard protocol
during bolus administration of intravenous contrast.
CONTRAST:  100mL OMNIPAQUE IOHEXOL 350 MG/ML SOLN

[Series 9: axial st · axial · 0.98mm/px · z∈[+732,+1112]mm · 13 of 84 slices shown, 15 images]
[im 4/84  soft-tissue]
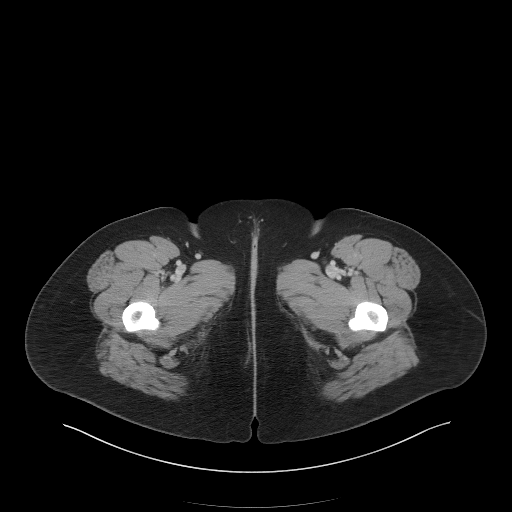
[im 4/84  bone]
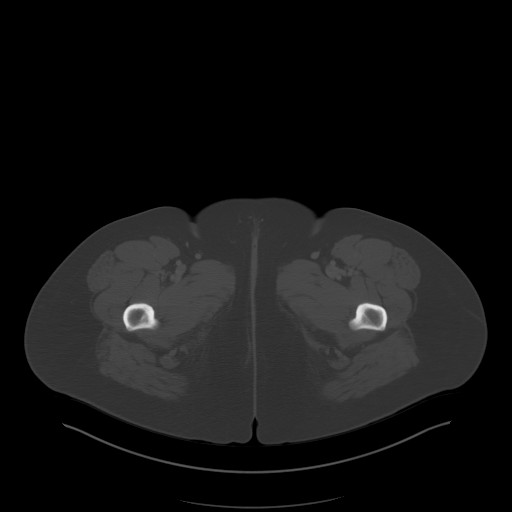
[im 10/84  soft-tissue]
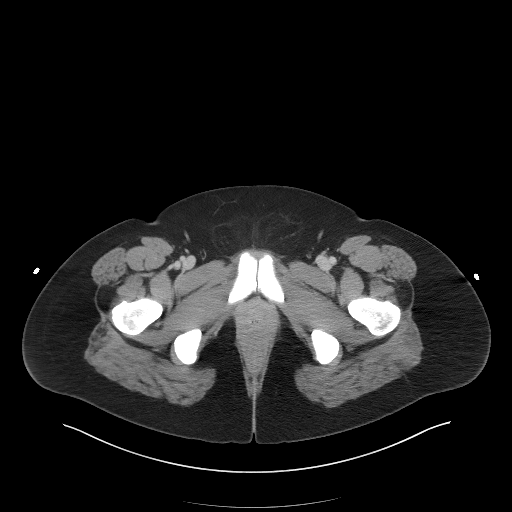
[im 16/84  soft-tissue]
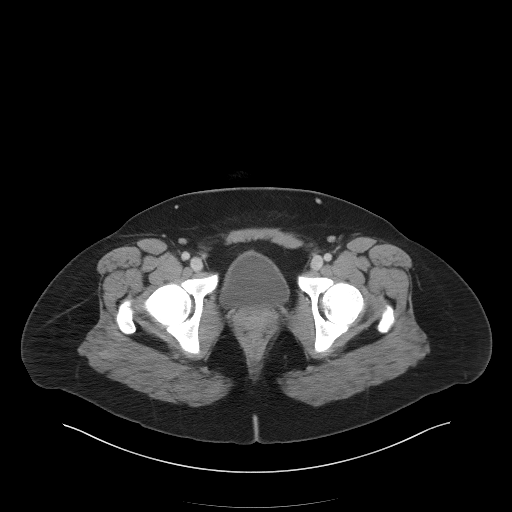
[im 23/84  soft-tissue]
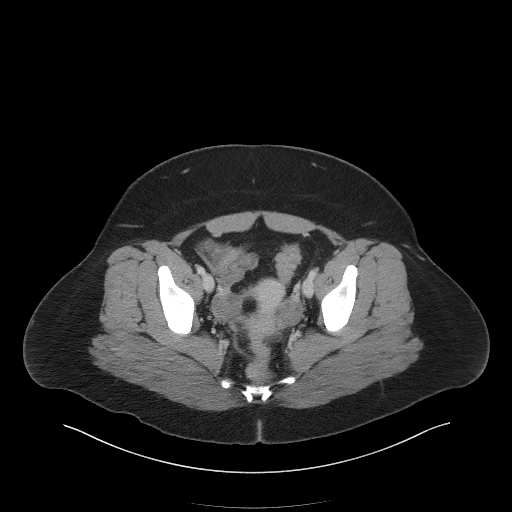
[im 29/84  soft-tissue]
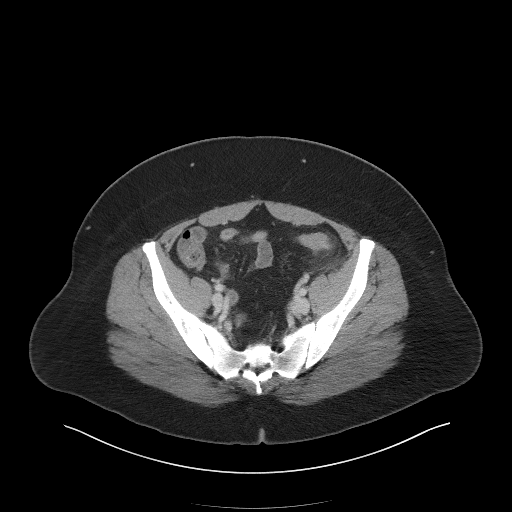
[im 36/84  soft-tissue]
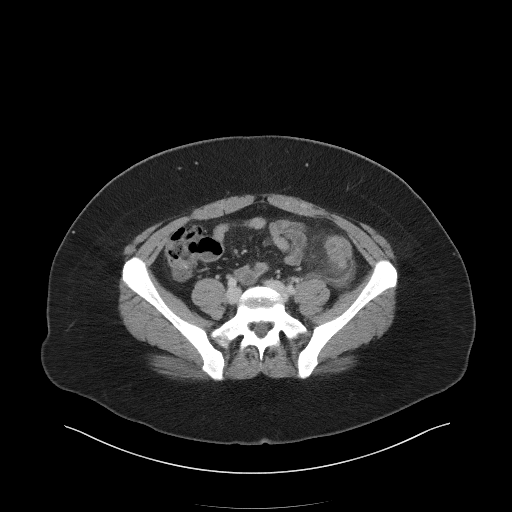
[im 42/84  soft-tissue]
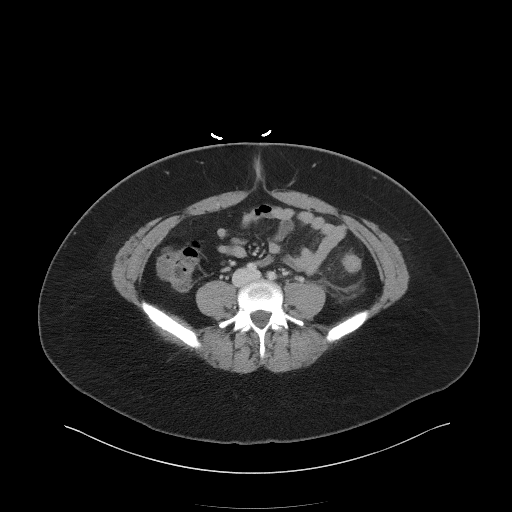
[im 48/84  soft-tissue]
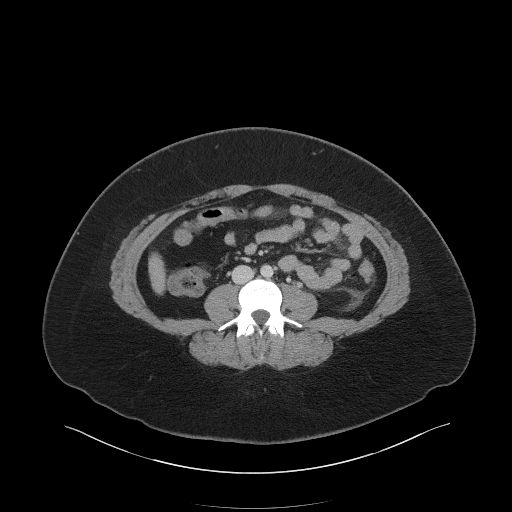
[im 55/84  soft-tissue]
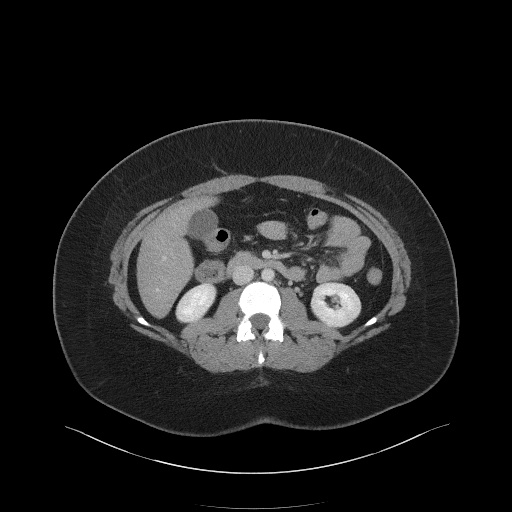
[im 55/84  bone]
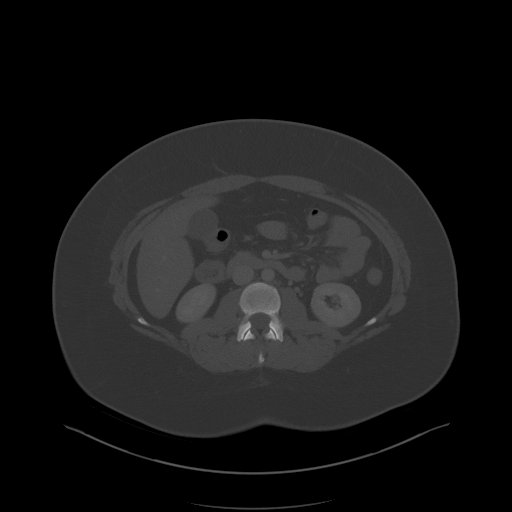
[im 61/84  soft-tissue]
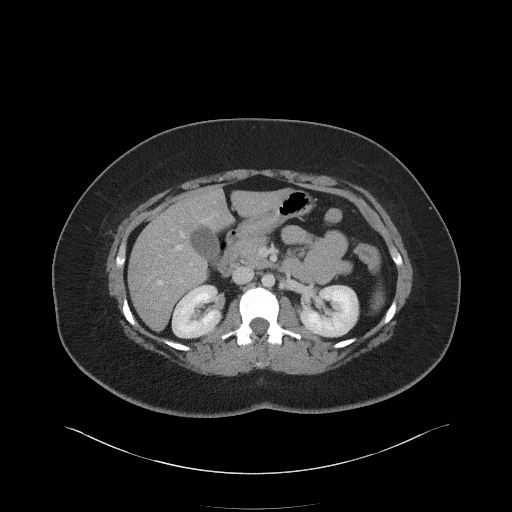
[im 68/84  soft-tissue]
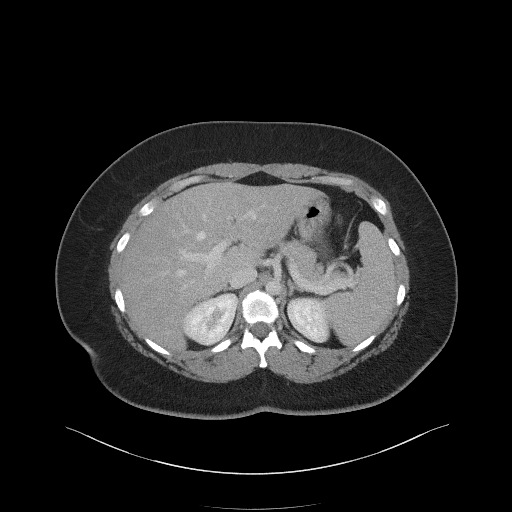
[im 74/84  soft-tissue]
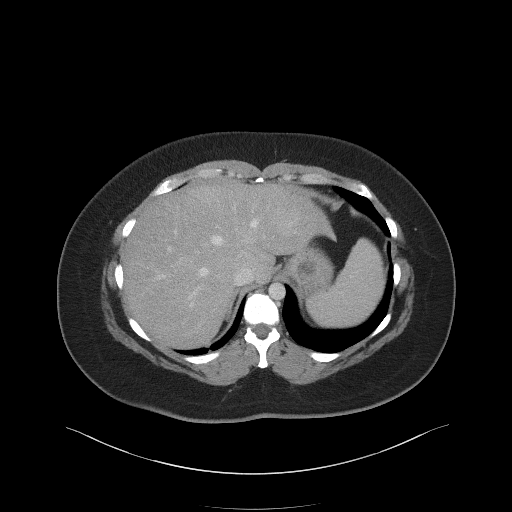
[im 80/84  soft-tissue]
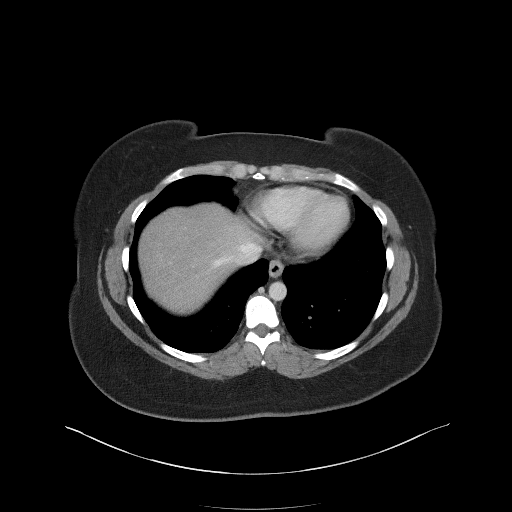

[Series 12: abd/pel coronal st · coronal · 0.82mm/px · 3 of 88 slices shown]
[im 30/88  soft-tissue]
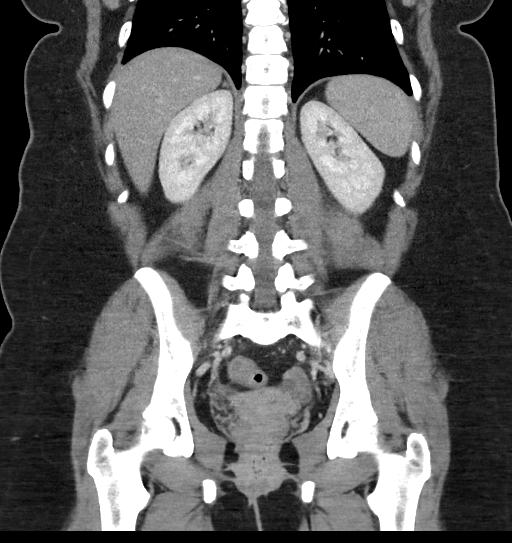
[im 39/88  soft-tissue]
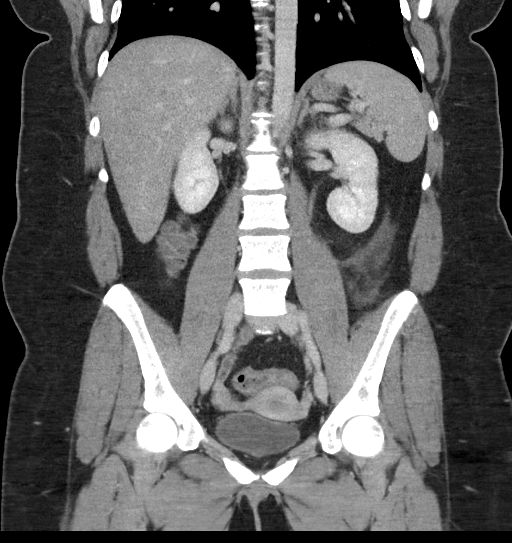
[im 49/88  soft-tissue]
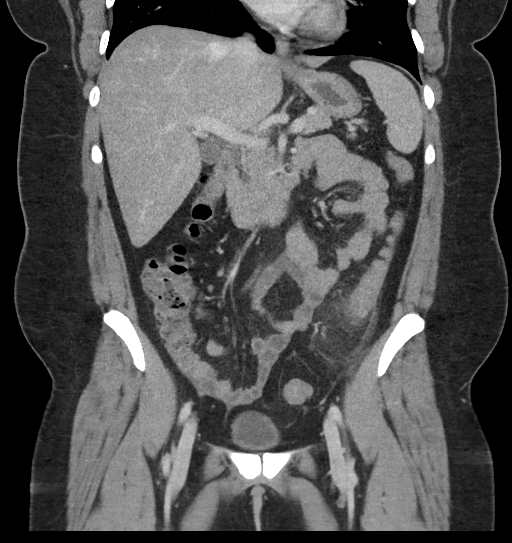

[16 of 46 positions shown; findings below may reference images not displayed]

FINDINGS: CTA CHEST FINDINGS

Cardiovascular: Satisfactory opacification of the pulmonary arteries
to the segmental level. No evidence of pulmonary embolism. Thin
section reformats are available. Normal heart size. No pericardial
effusion.

Mediastinum/Nodes: Negative for adenopathy or mass.

Lungs/Pleura: Mild mosaic attenuation of the lungs, usually from air
trapping. There is no edema, consolidation, effusion, or
pneumothorax.

Musculoskeletal: Negative

Review of the MIP images confirms the above findings.

CT ABDOMEN and PELVIS FINDINGS

Hepatobiliary: Multiple hypervascular liver masses on chest CTA
which are very subtle on portal venous phase. These are marked on
series 4 of the chest CTA. Two masses in the left lobe liver, the
larger measuring 3 cm on [DATE]. On the same series image 66, 4
lesions are highlighted and measure up to 19 mm. An even smaller
inferior and posterior right lobe lesion is seen on [DATE]. No signs
of cirrhosis or reported history of malignancy.

No evidence of biliary obstruction or stone.

Pancreas: Unremarkable.

Spleen: Unremarkable.

Adrenals/Urinary Tract: Negative adrenals. No hydronephrosis or
stone. Unremarkable bladder.

Stomach/Bowel: Focal colonic wall thickening with adjacent fat
inflammation at the level of a thickened diverticulum of the
descending segment. Colonic diverticulosis is moderate at the level
of the descending segment when considering age.

Vascular/Lymphatic: No acute vascular abnormality. No mass or
adenopathy.

Reproductive:Negative

Other: No ascites or pneumoperitoneum.

Musculoskeletal: No acute abnormalities.

Review of the MIP images confirms the above findings.
IMPRESSION: Chest CTA:

No acute finding.

Abdominal CT:

1. Descending colonic diverticulitis. No abscess or
pneumoperitoneum.
2. Multiple hypervascular liver masses, most apparent on the
arterial phase, favor adenoma or FNH. Recommend outpatient enhanced
liver MRI.

## 2023-12-09 ENCOUNTER — Ambulatory Visit
Admission: EM | Admit: 2023-12-09 | Discharge: 2023-12-09 | Disposition: A | Discharge status: Home / Self Care | Attending: Family Medicine | Admitting: Family Medicine

## 2023-12-09 DIAGNOSIS — K5792 Diverticulitis of intestine, part unspecified, without perforation or abscess without bleeding: Secondary | ICD-10-CM | POA: Diagnosis not present

## 2023-12-09 LAB — POCT URINALYSIS DIP (MANUAL ENTRY)
Glucose, UA: NEGATIVE mg/dL
Leukocytes, UA: NEGATIVE
Nitrite, UA: NEGATIVE
Spec Grav, UA: 1.02 (ref 1.010–1.025)
Urobilinogen, UA: 0.2 U/dL
pH, UA: 6 (ref 5.0–8.0)

## 2023-12-09 LAB — POCT URINE PREGNANCY: Preg Test, Ur: NEGATIVE

## 2023-12-09 MED ORDER — AMOXICILLIN-POT CLAVULANATE 875-125 MG PO TABS: 1.0000 | ORAL_TABLET | Freq: Two times a day (BID) | ORAL | 0 refills | Status: AC

## 2023-12-09 NOTE — Discharge Instructions (Addendum)
 You have declined to go to the emergency room today. I suspect you have recurrent diverticulitis, start Augmentin  for this. If you have no improvement in 48 hours and definitely if you worsen then please go to the emergency room immediately. Push fluids, clear liquids. Do not use any nonsteroidal anti-inflammatories (NSAIDs) like ibuprofen, Motrin, naproxen, Aleve, etc. which are all available over-the-counter.  Please just use Tylenol  at a dose of 500mg -650mg  once every 6 hours as needed for your aches, pains, fevers.

## 2023-12-09 NOTE — ED Triage Notes (Signed)
 Pt c/o left side abd/flank pain/ cramping x 4 days, nausea x today -states feels like "diverticulitis pain"-NAD-steady gait

## 2023-12-09 NOTE — ED Provider Notes (Signed)
 [facility]  Note:  This document was prepared using Conservation officer, historic buildings and may include unintentional dictation errors.  MRN: [id number] DOB: 1990-09-30  Subjective:   Wanda Braun is a 33 y.o. female presenting for 4-day history of recurrent severe left-sided abdominal pain over the left lower quadrant.  Has a history that is well-documented for sigmoid diverticulitis.  Has never had peritonitis, colonic abscess.  Does not have a GI specialist.  No diarrhea, bloody stools, vomiting.  No urinary symptoms.  No current facility-administered medications for this encounter.  Current Outpatient Medications:    ciprofloxacin  (CIPRO ) 500 MG tablet, Take 1 tablet (500 mg total) by mouth 2 (two) times daily., Disp: 14 tablet, Rfl: 0   ciprofloxacin  (CIPRO ) 500 MG tablet, Take 1 tablet (500 mg total) by mouth every 12 (twelve) hours., Disp: 10 tablet, Rfl: 0   metroNIDAZOLE  (FLAGYL ) 500 MG tablet, Take 1 tablet (500 mg total) by mouth 3 (three) times daily., Disp: 21 tablet, Rfl: 0   metroNIDAZOLE  (FLAGYL ) 500 MG tablet, Take 1 tablet (500 mg total) by mouth 4 (four) times daily., Disp: 14 tablet, Rfl: 0   ondansetron  (ZOFRAN ) 4 MG tablet, Take 2 tablets (8 mg total) by mouth every 8 (eight) hours as needed for nausea., Disp: 20 tablet, Rfl: 0   Allergies  Allergen Reactions   Fish Allergy Anaphylaxis   Shellfish Allergy Swelling    Seafood     Past Medical History:  Diagnosis Date   Depression    Diverticulitis      Past Surgical History:  Procedure Laterality Date   ADENOIDECTOMY      No family history on file.  Social History   Tobacco Use   Smoking status: Never   Smokeless tobacco: Never  Vaping Use   Vaping status: Never Used  Substance Use Topics   Alcohol use: No   Drug use: No    ROS   Objective:   Vitals: BP 112/70 (BP Location: Left Arm)   Pulse 92   Temp 98.8 F (37.1 C) (Oral)   Resp 20   LMP 12/01/2023    SpO2 96%   Physical Exam Constitutional:      General: She is not in acute distress.    Appearance: Normal appearance. She is well-developed. She is not ill-appearing, toxic-appearing or diaphoretic.  HENT:     Head: Normocephalic and atraumatic.     Nose: Nose normal.     Mouth/Throat:     Mouth: Mucous membranes are moist.     Pharynx: Oropharynx is clear.  Eyes:     General: No scleral icterus.       Right eye: No discharge.        Left eye: No discharge.     Extraocular Movements: Extraocular movements intact.     Conjunctiva/sclera: Conjunctivae normal.  Cardiovascular:     Rate and Rhythm: Normal rate.  Pulmonary:     Effort: Pulmonary effort is normal.  Abdominal:     General: Bowel sounds are normal. There is no distension.     Palpations: Abdomen is soft. There is no mass.     Tenderness: There is abdominal tenderness in the left lower quadrant. There is no right CVA tenderness, left CVA tenderness, guarding or rebound.  Skin:    General: Skin is warm and dry.  Neurological:     General: No focal deficit present.     Mental Status: She is alert and oriented to person, place, and  time.  Psychiatric:        Mood and Affect: Mood normal.        Behavior: Behavior normal.        Thought Content: Thought content normal.        Judgment: Judgment normal.     Results for orders placed or performed during the hospital encounter of 12/09/23 (from the past 24 hours)  POCT urine pregnancy     Status: None   Collection Time: 12/09/23  7:27 PM  Result Value Ref Range   Preg Test, Ur Negative Negative  POCT urinalysis dipstick     Status: Abnormal   Collection Time: 12/09/23  7:27 PM  Result Value Ref Range   Color, UA yellow yellow   Clarity, UA clear clear   Glucose, UA negative negative mg/dL   Bilirubin, UA moderate (A) negative   Ketones, POC UA large (80) (A) negative mg/dL   Spec Grav, UA 1.610 9.604 - 1.025   Blood, UA small (A) negative   pH, UA 6.0 5.0 - 8.0    Protein Ur, POC trace (A) negative mg/dL   Urobilinogen, UA 0.2 0.2 or 1.0 E.U./dL   Nitrite, UA Negative Negative   Leukocytes, UA Negative Negative    Assessment and Plan :   PDMP not reviewed this encounter.  1. Acute diverticulitis    Patient does not want to go to the emergency room.  She would like to trial oral antibiotics.  Discussed the risks of severe untreated diverticulitis.  Patient verbalizes understanding.  Recommended clear liquid diet.  Maintain strict ER precautions.  Establish care with a GI specialist soon as possible.   Adolph Hoop, [place] 12/09/23 [id number]

## 2023-12-11 ENCOUNTER — Emergency Department (HOSPITAL_BASED_OUTPATIENT_CLINIC_OR_DEPARTMENT_OTHER)

## 2023-12-11 ENCOUNTER — Other Ambulatory Visit: Payer: Self-pay

## 2023-12-11 ENCOUNTER — Encounter (HOSPITAL_BASED_OUTPATIENT_CLINIC_OR_DEPARTMENT_OTHER): Payer: Self-pay | Admitting: Emergency Medicine

## 2023-12-11 ENCOUNTER — Emergency Department (HOSPITAL_BASED_OUTPATIENT_CLINIC_OR_DEPARTMENT_OTHER)
Admission: EM | Admit: 2023-12-11 | Discharge: 2023-12-11 | Disposition: A | Discharge status: Home / Self Care | Attending: Emergency Medicine | Admitting: Emergency Medicine

## 2023-12-11 DIAGNOSIS — R7401 Elevation of levels of liver transaminase levels: Secondary | ICD-10-CM | POA: Insufficient documentation

## 2023-12-11 DIAGNOSIS — E860 Dehydration: Secondary | ICD-10-CM | POA: Insufficient documentation

## 2023-12-11 DIAGNOSIS — K5792 Diverticulitis of intestine, part unspecified, without perforation or abscess without bleeding: Secondary | ICD-10-CM

## 2023-12-11 DIAGNOSIS — K5732 Diverticulitis of large intestine without perforation or abscess without bleeding: Secondary | ICD-10-CM | POA: Insufficient documentation

## 2023-12-11 DIAGNOSIS — R1032 Left lower quadrant pain: Secondary | ICD-10-CM | POA: Diagnosis present

## 2023-12-11 LAB — CBC WITH DIFFERENTIAL/PLATELET
Abs Immature Granulocytes: 0.03 10*3/uL (ref 0.00–0.07)
Basophils Absolute: 0 10*3/uL (ref 0.0–0.1)
Basophils Relative: 0 %
Eosinophils Absolute: 0.5 10*3/uL (ref 0.0–0.5)
Eosinophils Relative: 5 %
HCT: 37.3 % (ref 36.0–46.0)
Hemoglobin: 12.7 g/dL (ref 12.0–15.0)
Immature Granulocytes: 0 %
Lymphocytes Relative: 17 %
Lymphs Abs: 1.8 10*3/uL (ref 0.7–4.0)
MCH: 28.9 pg (ref 26.0–34.0)
MCHC: 34 g/dL (ref 30.0–36.0)
MCV: 84.8 fL (ref 80.0–100.0)
Monocytes Absolute: 0.5 10*3/uL (ref 0.1–1.0)
Monocytes Relative: 5 %
Neutro Abs: 7.8 10*3/uL — ABNORMAL HIGH (ref 1.7–7.7)
Neutrophils Relative %: 73 %
Platelets: 357 10*3/uL (ref 150–400)
RBC: 4.4 MIL/uL (ref 3.87–5.11)
RDW: 12.1 % (ref 11.5–15.5)
WBC: 10.5 10*3/uL (ref 4.0–10.5)
nRBC: 0 % (ref 0.0–0.2)

## 2023-12-11 LAB — COMPREHENSIVE METABOLIC PANEL WITH GFR
ALT: 61 U/L — ABNORMAL HIGH (ref 0–44)
AST: 31 U/L (ref 15–41)
Albumin: 4.3 g/dL (ref 3.5–5.0)
Alkaline Phosphatase: 96 U/L (ref 38–126)
Anion gap: 14 (ref 5–15)
BUN: 7 mg/dL (ref 6–20)
CO2: 22 mmol/L (ref 22–32)
Calcium: 10 mg/dL (ref 8.9–10.3)
Chloride: 101 mmol/L (ref 98–111)
Creatinine, Ser: 0.87 mg/dL (ref 0.44–1.00)
GFR, Estimated: >60 mL/min (ref >60)
Glucose, Bld: 91 mg/dL (ref 70–99)
Potassium: 4.2 mmol/L (ref 3.5–5.1)
Sodium: 137 mmol/L (ref 135–145)
Total Bilirubin: 0.5 mg/dL (ref 0.0–1.2)
Total Protein: 7.5 g/dL (ref 6.5–8.1)

## 2023-12-11 LAB — URINALYSIS, ROUTINE W REFLEX MICROSCOPIC
Bilirubin Urine: NEGATIVE
Glucose, UA: NEGATIVE mg/dL
Hgb urine dipstick: NEGATIVE
Ketones, ur: 40 mg/dL — AB
Leukocytes,Ua: NEGATIVE
Nitrite: NEGATIVE
Protein, ur: NEGATIVE mg/dL
Specific Gravity, Urine: 1.015 (ref 1.005–1.030)
pH: 5.5 (ref 5.0–8.0)

## 2023-12-11 LAB — PREGNANCY, URINE: Preg Test, Ur: NEGATIVE

## 2023-12-11 LAB — LIPASE, BLOOD: Lipase: 18 U/L (ref 11–51)

## 2023-12-11 MED ORDER — ALUM & MAG HYDROXIDE-SIMETH 200-200-20 MG/5ML PO SUSP
30.0000 mL | Freq: Once | ORAL | Status: AC
  Administered 2023-12-11: 30 mL via ORAL
  Filled 2023-12-11: qty 30

## 2023-12-11 MED ORDER — SODIUM CHLORIDE 0.9 % IV BOLUS
1000.0000 mL | Freq: Once | INTRAVENOUS | Status: AC
  Administered 2023-12-11: 1000 mL via INTRAVENOUS

## 2023-12-11 MED ORDER — LIDOCAINE VISCOUS HCL 2 % MT SOLN
15.0000 mL | Freq: Once | OROMUCOSAL | Status: AC
  Administered 2023-12-11: 15 mL via ORAL
  Filled 2023-12-11: qty 15

## 2023-12-11 MED ORDER — ONDANSETRON HCL 4 MG/2ML IJ SOLN
4.0000 mg | Freq: Once | INTRAMUSCULAR | Status: AC
  Administered 2023-12-11: 4 mg via INTRAVENOUS
  Filled 2023-12-11: qty 2

## 2023-12-11 MED ORDER — IOHEXOL 300 MG/ML  SOLN
100.0000 mL | Freq: Once | INTRAMUSCULAR | Status: AC | PRN
  Administered 2023-12-11: 100 mL via INTRAVENOUS

## 2023-12-11 MED ORDER — HYDROCODONE-ACETAMINOPHEN 5-325 MG PO TABS: 2.0000 | ORAL_TABLET | Freq: Four times a day (QID) | ORAL | 0 refills | Status: AC | PRN

## 2023-12-11 MED ORDER — MORPHINE SULFATE (PF) 4 MG/ML IV SOLN
4.0000 mg | Freq: Once | INTRAVENOUS | Status: AC
  Administered 2023-12-11: 4 mg via INTRAVENOUS
  Filled 2023-12-11: qty 1

## 2023-12-11 MED ORDER — ONDANSETRON 4 MG PO TBDP: 4.0000 mg | ORAL_TABLET | Freq: Three times a day (TID) | ORAL | 0 refills | Status: AC | PRN

## 2023-12-11 NOTE — ED Provider Notes (Signed)
 [place] [facility] Provider Note   CSN: [id number] Arrival date & time: 12/11/23  1534     History  Chief Complaint  Patient presents with   Abdominal Pain    Wanda Braun is a 33 y.o. female past medical history significant for diverticulitis presents today for left lower quadrant abdominal pain x 2 days.  Patient was started on Augmentin  from urgent care on 4/21 but the pain has gotten worse.  Patient also endorses nausea.  Patient denies fever, vomiting, diarrhea chills, chest pain, shortness of breath, hematemesis, blood in stool, or urinary symptoms.  Patient is currently on Tirzepatide for weight loss   Abdominal Pain Associated symptoms: nausea        Home Medications Prior to Admission medications   Medication Sig Start Date End Date Taking? Authorizing Provider  HYDROcodone -acetaminophen  (NORCO/VICODIN) 5-325 MG tablet Take 2 tablets by mouth every 6 (six) hours as needed for up to 5 days for severe pain (pain score 7-10). 12/11/23 12/16/23 Yes Carie Charity, PA-C  ondansetron  (ZOFRAN -ODT) 4 MG disintegrating tablet Take 1 tablet (4 mg total) by mouth every 8 (eight) hours as needed for nausea or vomiting. 12/11/23  Yes Neliah Cuyler N, PA-C  amoxicillin -clavulanate (AUGMENTIN ) 875-125 MG tablet Take 1 tablet by mouth 2 (two) times daily. 12/09/23   Adolph Hoop, PA-C  ciprofloxacin  (CIPRO ) 500 MG tablet Take 1 tablet (500 mg total) by mouth 2 (two) times daily. 03/23/21   Kelsey Patricia, MD  ciprofloxacin  (CIPRO ) 500 MG tablet Take 1 tablet (500 mg total) by mouth every 12 (twelve) hours. 10/19/22   Zelaya, Oscar A, PA-C  metroNIDAZOLE  (FLAGYL ) 500 MG tablet Take 1 tablet (500 mg total) by mouth 3 (three) times daily. 03/23/21   Kelsey Patricia, MD  metroNIDAZOLE  (FLAGYL ) 500 MG tablet Take 1 tablet (500 mg total) by mouth 4 (four) times daily. 10/19/22   Zelaya, Oscar A, PA-C      Allergies    Fish allergy and Shellfish allergy    Review  of Systems   Review of Systems  Gastrointestinal:  Positive for abdominal pain and nausea.    Physical Exam Updated Vital Signs BP 130/86 (BP Location: Right Arm)   Pulse (!) 109   Temp 99.1 F (37.3 C) (Oral)   Resp 16   Ht 4\' 11"  (1.499 m)   Wt 97.5 kg   LMP 12/01/2023   SpO2 99%   BMI 43.42 kg/m  Physical Exam Vitals and nursing note reviewed.  Constitutional:      General: She is not in acute distress.    Appearance: She is well-developed. She is obese. She is not toxic-appearing or diaphoretic.  HENT:     Head: Normocephalic and atraumatic.     Mouth/Throat:     Mouth: Mucous membranes are moist.  Eyes:     Conjunctiva/sclera: Conjunctivae normal.  Cardiovascular:     Rate and Rhythm: Normal rate and regular rhythm.     Heart sounds: Normal heart sounds. No murmur heard. Pulmonary:     Effort: Pulmonary effort is normal. No respiratory distress.     Breath sounds: Normal breath sounds.  Abdominal:     General: Bowel sounds are normal. There is no distension.     Palpations: Abdomen is soft.     Tenderness: There is abdominal tenderness in the left upper quadrant and left lower quadrant. Negative signs include Murphy's sign, Rovsing's sign and McBurney's sign.  Musculoskeletal:  General: No swelling.     Cervical back: Neck supple.  Skin:    General: Skin is warm and dry.     Capillary Refill: Capillary refill takes less than 2 seconds.  Neurological:     General: No focal deficit present.     Mental Status: She is alert and oriented to person, place, and time.  Psychiatric:        Mood and Affect: Mood normal.     ED Results / Procedures / Treatments   Labs (all labs ordered are listed, but only abnormal results are displayed) Labs Reviewed  COMPREHENSIVE METABOLIC PANEL WITH GFR - Abnormal; Notable for the following components:      Result Value   ALT 61 (*)    All other components within normal limits  URINALYSIS, ROUTINE W REFLEX MICROSCOPIC  - Abnormal; Notable for the following components:   Ketones, ur 40 (*)    All other components within normal limits  CBC WITH DIFFERENTIAL/PLATELET - Abnormal; Notable for the following components:   Neutro Abs 7.8 (*)    All other components within normal limits  LIPASE, BLOOD  PREGNANCY, URINE    EKG None  Radiology CT ABDOMEN PELVIS W CONTRAST Result Date: 12/11/2023 CLINICAL DATA:  LLQ abdominal pain Hx diverticulitis. 33 y/o female c/o LT side abd pain; hx of diverticulitis; was started on abx 4/21, but pain is worse; +nausea . No surgery EXAM: CT ABDOMEN AND PELVIS WITH CONTRAST TECHNIQUE: Multidetector CT imaging of the abdomen and pelvis was performed using the standard protocol following bolus administration of intravenous contrast. RADIATION DOSE REDUCTION: This exam was performed according to the departmental dose-optimization program which includes automated exposure control, adjustment of the mA and/or kV according to patient size and/or use of iterative reconstruction technique. CONTRAST:  OMNIPAQUE  IOHEXOL  300 MG/ML  SOLN COMPARISON:  CT abdomen pelvis 10/19/2022 FINDINGS: Lower chest: No acute abnormality. Hepatobiliary: The hepatic parenchyma is diffusely hypodense compared to the splenic parenchyma consistent with fatty infiltration. Similar-appearing vague areas of right and left hepatic lobe slight hyperdensity (2: 12, 15, 18, 20, 30). No gallstones, gallbladder wall thickening, or pericholecystic fluid. No biliary dilatation. Pancreas: No focal lesion. Normal pancreatic contour. No surrounding inflammatory changes. No main pancreatic ductal dilatation. Spleen: Normal in size without focal abnormality. Adrenals/Urinary Tract: No adrenal nodule bilaterally. Bilateral kidneys enhance symmetrically. No hydronephrosis. No hydroureter. The urinary bladder is unremarkable. Stomach/Bowel: Stomach is within normal limits. No evidence of small bowel wall thickening or dilatation.  Colonic diverticulosis. Short segment of large bowel wall thickening with pericolonic fat stranding along a diverticula of the distal descending/proximal sigmoid colon. Appendix appears normal. Vascular/Lymphatic: No abdominal aorta or iliac aneurysm. No abdominal, pelvic, or inguinal lymphadenopathy. Reproductive: Uterus and bilateral adnexa are unremarkable. Other: No intraperitoneal free fluid. No intraperitoneal free gas. No organized fluid collection. Musculoskeletal: No abdominal wall hernia or abnormality. Densely sclerotic lesion of the right inter trochanteric lesion chronic and likely representing a bone island. No suspicious lytic or blastic osseous lesions. No acute displaced fracture. IMPRESSION: 1. Colonic diverticulosis with acute uncomplicated diverticulitis of the distal descending/proximal sigmoid colon. Recommend colonoscopy status post treatment and status post complete resolution of inflammatory changes to exclude an underlying lesion. 2. Hepatic steatosis with similar-appearing vague areas of right and left hepatic lobe slight hyperdensity. Finding may represent regions of focal fatty sparing versus true lesion such as adenoma or FNH. If not already obtained, when the patient is clinically stable and able to follow directions and  hold their breath (preferably as an outpatient) further evaluation with dedicated MRI liver protocol should be considered. Electronically Signed   By: Morgane  Naveau M.D.   On: 12/11/2023 18:13    Procedures Procedures    Medications Ordered in ED Medications  ondansetron  (ZOFRAN ) injection 4 mg (has no administration in time range)  ondansetron  (ZOFRAN ) injection 4 mg (4 mg Intravenous Given 12/11/23 1650)  morphine  (PF) 4 MG/ML injection 4 mg (4 mg Intravenous Given 12/11/23 1650)  sodium chloride  0.9 % bolus 1,000 mL (1,000 mLs Intravenous New Bag/Given 12/11/23 1735)  iohexol  (OMNIPAQUE ) 300 MG/ML solution 100 mL (100 mLs Intravenous Contrast Given  12/11/23 1719)    ED Course/ Medical Decision Making/ A&P                                 Medical Decision Making Amount and/or Complexity of Data Reviewed Labs: ordered. Radiology: ordered.  Risk Prescription drug management.   This patient presents to the ED for concern of abdominal pain with nausea, this involves an extensive number of treatment options, and is a complaint that carries with it a high risk of complications and morbidity.  The differential diagnosis includes diverticulitis, choledocholithiasis, pancreatitis, appendicitis, acute cholecystitis, viral GI illness   Co morbidities that complicate the patient evaluation  History of diverticulitis   Additional history obtained:  External records from outside source obtained and reviewed including Care Everywhere   Lab Tests:  I Ordered, and personally interpreted labs.  The pertinent results include: Elevated ALT at 61, UA with 40 ketones   Imaging Studies ordered:  I ordered imaging studies including CT abdomen pelvis with contrast I independently visualized and interpreted imaging which showed acute uncomplicated diverticulitis of the distal descending/proximal sigmoid colon.  Hepatic steatosis with similar-appearing vague areas of right and left hepatic lobe slight hyperdensity.  Further evaluation with dedicated MRI liver protocol outpatient recommended I agree with the radiologist interpretation    Problem List / ED Course / Critical interventions / Medication management  I ordered medication including Zofran  and morphine  for nausea and pain 1 L NS for mild dehydration Reevaluation of the patient after these medicines showed that the patient improved I have reviewed the patients home medicines and have made adjustments as needed Patient able to tolerate p.o. intake without issue.   Test / Admission - Considered:  Considered for admission or further workup however patient's vital signs, physical  exam, labs, and imaging were reassuring.  Patient given Zofran  and short course of Norco for severe pain.  Patient advised to finish taking her Augmentin  from urgent care.  Patient given return precautions.  I feel patient is safe for discharge at this time.        Final Clinical Impression(s) / ED Diagnoses Final diagnoses:  Diverticulitis    Rx / DC Orders ED Discharge Orders          Ordered    ondansetron  (ZOFRAN -ODT) 4 MG disintegrating tablet  Every 8 hours PRN        12/11/23 1838    HYDROcodone -acetaminophen  (NORCO/VICODIN) 5-325 MG tablet  Every 6 hours PRN        12/11/23 1838              Carie Charity, PA-C 12/11/23 1859    Tegeler, Marine Sia, MD 12/11/23 2049

## 2023-12-11 NOTE — Discharge Instructions (Addendum)
 Today you were seen for diverticulitis.  Please pick up your medication and take as prescribed.  Please return to the ED if you have uncontrollable vomiting, worsening pain, or fever.  You did have an incidental finding on your CT which requires further evaluation outpatient with MRI.  Please follow-up with your PCP so that they can order this MRI for further evaluation.  Thank you for letting us  treat you today. After reviewing your labs and imaging, I feel you are safe to go home. Please follow up with your PCP in the next several days and provide them with your records from this visit. Return to the Emergency Room if pain becomes severe or symptoms worsen.

## 2023-12-11 NOTE — ED Triage Notes (Signed)
 Pt c/o LT side abd pain; hx of diverticulitis; was started on abx 4/21, but pain is worse; +nausea

## 2023-12-24 ENCOUNTER — Emergency Department (HOSPITAL_BASED_OUTPATIENT_CLINIC_OR_DEPARTMENT_OTHER): Admission: EM | Admit: 2023-12-24 | Discharge: 2023-12-24 | Disposition: A | Discharge status: Home / Self Care

## 2023-12-24 ENCOUNTER — Encounter (HOSPITAL_BASED_OUTPATIENT_CLINIC_OR_DEPARTMENT_OTHER): Payer: Self-pay | Admitting: Emergency Medicine

## 2023-12-24 ENCOUNTER — Other Ambulatory Visit: Payer: Self-pay

## 2023-12-24 DIAGNOSIS — R1030 Lower abdominal pain, unspecified: Secondary | ICD-10-CM | POA: Diagnosis present

## 2023-12-24 DIAGNOSIS — R1012 Left upper quadrant pain: Secondary | ICD-10-CM | POA: Diagnosis not present

## 2023-12-24 DIAGNOSIS — R112 Nausea with vomiting, unspecified: Secondary | ICD-10-CM | POA: Insufficient documentation

## 2023-12-24 DIAGNOSIS — R197 Diarrhea, unspecified: Secondary | ICD-10-CM | POA: Diagnosis not present

## 2023-12-24 DIAGNOSIS — R7401 Elevation of levels of liver transaminase levels: Secondary | ICD-10-CM | POA: Insufficient documentation

## 2023-12-24 LAB — URINALYSIS, ROUTINE W REFLEX MICROSCOPIC
Glucose, UA: NEGATIVE mg/dL
Ketones, ur: 15 mg/dL — AB
Leukocytes,Ua: NEGATIVE
Nitrite: NEGATIVE
Protein, ur: 30 mg/dL — AB
Specific Gravity, Urine: >=1.03 (ref 1.005–1.030)
pH: 5.5 (ref 5.0–8.0)

## 2023-12-24 LAB — CBC WITH DIFFERENTIAL/PLATELET
Abs Immature Granulocytes: 0.02 10*3/uL (ref 0.00–0.07)
Basophils Absolute: 0 10*3/uL (ref 0.0–0.1)
Basophils Relative: 0 %
Eosinophils Absolute: 0.2 10*3/uL (ref 0.0–0.5)
Eosinophils Relative: 2 %
HCT: 39.1 % (ref 36.0–46.0)
Hemoglobin: 13.1 g/dL (ref 12.0–15.0)
Immature Granulocytes: 0 %
Lymphocytes Relative: 18 %
Lymphs Abs: 1.6 10*3/uL (ref 0.7–4.0)
MCH: 28.9 pg (ref 26.0–34.0)
MCHC: 33.5 g/dL (ref 30.0–36.0)
MCV: 86.1 fL (ref 80.0–100.0)
Monocytes Absolute: 0.3 10*3/uL (ref 0.1–1.0)
Monocytes Relative: 4 %
Neutro Abs: 6.8 10*3/uL (ref 1.7–7.7)
Neutrophils Relative %: 76 %
Platelets: 318 10*3/uL (ref 150–400)
RBC: 4.54 MIL/uL (ref 3.87–5.11)
RDW: 12.5 % (ref 11.5–15.5)
WBC: 9 10*3/uL (ref 4.0–10.5)
nRBC: 0 % (ref 0.0–0.2)

## 2023-12-24 LAB — COMPREHENSIVE METABOLIC PANEL WITH GFR
ALT: 86 U/L — ABNORMAL HIGH (ref 0–44)
AST: 38 U/L (ref 15–41)
Albumin: 4.3 g/dL (ref 3.5–5.0)
Alkaline Phosphatase: 87 U/L (ref 38–126)
Anion gap: 12 (ref 5–15)
BUN: 9 mg/dL (ref 6–20)
CO2: 24 mmol/L (ref 22–32)
Calcium: 9.7 mg/dL (ref 8.9–10.3)
Chloride: 102 mmol/L (ref 98–111)
Creatinine, Ser: 0.84 mg/dL (ref 0.44–1.00)
GFR, Estimated: >60 mL/min (ref >60)
Glucose, Bld: 85 mg/dL (ref 70–99)
Potassium: 4 mmol/L (ref 3.5–5.1)
Sodium: 138 mmol/L (ref 135–145)
Total Bilirubin: 0.7 mg/dL (ref 0.0–1.2)
Total Protein: 7.3 g/dL (ref 6.5–8.1)

## 2023-12-24 LAB — URINALYSIS, MICROSCOPIC (REFLEX): WBC, UA: NONE SEEN WBC/hpf (ref 0–5)

## 2023-12-24 LAB — LIPASE, BLOOD: Lipase: 22 U/L (ref 11–51)

## 2023-12-24 LAB — PREGNANCY, URINE: Preg Test, Ur: NEGATIVE

## 2023-12-24 MED ORDER — ONDANSETRON 4 MG PO TBDP
4.0000 mg | ORAL_TABLET | Freq: Once | ORAL | Status: AC
  Administered 2023-12-24: 4 mg via ORAL
  Filled 2023-12-24: qty 1

## 2023-12-24 MED ORDER — DICYCLOMINE HCL 10 MG PO CAPS
10.0000 mg | ORAL_CAPSULE | Freq: Once | ORAL | Status: AC
Start: 2023-12-24 — End: 2023-12-24
  Administered 2023-12-24: 10 mg via ORAL
  Filled 2023-12-24: qty 1

## 2023-12-24 MED ORDER — ONDANSETRON 4 MG PO TBDP: 4.0000 mg | ORAL_TABLET | Freq: Three times a day (TID) | ORAL | 0 refills | Status: AC | PRN

## 2023-12-24 MED ORDER — PROMETHAZINE HCL 25 MG PO TABS: 25.0000 mg | ORAL_TABLET | Freq: Four times a day (QID) | ORAL | 0 refills | Status: AC | PRN

## 2023-12-24 MED ORDER — PROMETHAZINE HCL 25 MG PO TABS
25.0000 mg | ORAL_TABLET | Freq: Once | ORAL | Status: AC
  Administered 2023-12-24: 25 mg via ORAL
  Filled 2023-12-24: qty 1

## 2023-12-24 NOTE — Discharge Instructions (Addendum)
 Today you were seen for abdominal pain with nausea and vomiting.  Please pick up your medication and take as prescribed.  Please continue taking your Bentyl as prescribed to you by GI.  If your symptoms persist please follow-up with GI for further evaluation and workup.  Thank you for letting us  treat you today. After reviewing your labs, I feel you are safe to go home. Please follow up with your PCP in the next several days and provide them with your records from this visit. Return to the Emergency Room if pain becomes severe or symptoms worsen.

## 2023-12-24 NOTE — ED Notes (Signed)
 Pt given water and crackers

## 2023-12-24 NOTE — ED Notes (Signed)
 Patient reports having some nausea during her PO challenge.  No vomiting. Had a BM.

## 2023-12-24 NOTE — ED Triage Notes (Signed)
 Pt reports lower abd pain; has been seen 2 times recently for same and saw GI today (rx'd dicyclomine and pantoprazole, but sts "they didn't help"); they are scheduling an outpt MRI; reports +NVD that intensified after her appt today

## 2023-12-24 NOTE — ED Provider Notes (Signed)
  [facility] AT [facility] Provider Note   CSN: [id number] Arrival date & time: 12/24/23  1647     History  Chief Complaint  Patient presents with   Abdominal Pain    Wanda Braun is a 33 y.o. female presents today for lower abdominal pain.  Patient has been been seen twice recently for same.  Patient was seen by gastroenterology today and prescribed dicyclomine and pantoprazole and states that they did not help.  Patient is scheduled for an outpatient MRI.  Patient reports nausea, vomiting, and diarrhea that intensified after her appointment today.  Patient denies fever, chill, constipation, rectal bleeding, hematemesis, chest pain, or shortness of breath.   Abdominal Pain Associated symptoms: diarrhea, nausea and vomiting        Home Medications Prior to Admission medications   Medication Sig Start Date End Date Taking? Authorizing Provider  ondansetron  (ZOFRAN -ODT) 4 MG disintegrating tablet Take 1 tablet (4 mg total) by mouth every 8 (eight) hours as needed for nausea or vomiting. 12/24/23  Yes Jennilee Demarco N, PA-C  amoxicillin -clavulanate (AUGMENTIN ) 875-125 MG tablet Take 1 tablet by mouth 2 (two) times daily. 12/09/23   Adolph Hoop, PA-C  ciprofloxacin  (CIPRO ) 500 MG tablet Take 1 tablet (500 mg total) by mouth 2 (two) times daily. 03/23/21   Kelsey Patricia, MD  ciprofloxacin  (CIPRO ) 500 MG tablet Take 1 tablet (500 mg total) by mouth every 12 (twelve) hours. 10/19/22   Zelaya, Oscar A, PA-C  metroNIDAZOLE  (FLAGYL ) 500 MG tablet Take 1 tablet (500 mg total) by mouth 3 (three) times daily. 03/23/21   Kelsey Patricia, MD  metroNIDAZOLE  (FLAGYL ) 500 MG tablet Take 1 tablet (500 mg total) by mouth 4 (four) times daily. 10/19/22   Zelaya, Oscar A, PA-C  ondansetron  (ZOFRAN -ODT) 4 MG disintegrating tablet Take 1 tablet (4 mg total) by mouth every 8 (eight) hours as needed for nausea or vomiting. 12/11/23   Carie Charity, PA-C      Allergies    Fish allergy  and Shellfish allergy    Review of Systems   Review of Systems  Gastrointestinal:  Positive for abdominal pain, diarrhea, nausea and vomiting.    Physical Exam Updated Vital Signs BP 115/66 (BP Location: Right Arm)   Pulse 99   Temp 98.8 F (37.1 C) (Oral)   Resp 18   Ht 4\' 11"  (1.499 m)   Wt 97.5 kg   LMP 12/01/2023   SpO2 97%   BMI 43.42 kg/m  Physical Exam Vitals and nursing note reviewed.  Constitutional:      General: She is not in acute distress.    Appearance: She is well-developed. She is obese. She is not ill-appearing, toxic-appearing or diaphoretic.  HENT:     Head: Normocephalic and atraumatic.  Eyes:     Conjunctiva/sclera: Conjunctivae normal.  Cardiovascular:     Rate and Rhythm: Normal rate and regular rhythm.     Heart sounds: No murmur heard. Pulmonary:     Effort: Pulmonary effort is normal. No respiratory distress.     Breath sounds: Normal breath sounds.  Abdominal:     General: There is no distension.     Palpations: Abdomen is soft.     Tenderness: There is abdominal tenderness in the left upper quadrant. There is no guarding or rebound. Negative signs include Murphy's sign, Rovsing's sign and McBurney's sign.  Musculoskeletal:        General: No swelling.     Cervical back: Neck supple.  Skin:    General: Skin is warm and dry.     Capillary Refill: Capillary refill takes less than 2 seconds.  Neurological:     General: No focal deficit present.     Mental Status: She is alert.  Psychiatric:        Mood and Affect: Mood normal.     ED Results / Procedures / Treatments   Labs (all labs ordered are listed, but only abnormal results are displayed) Labs Reviewed  URINALYSIS, ROUTINE W REFLEX MICROSCOPIC - Abnormal; Notable for the following components:      Result Value   Hgb urine dipstick TRACE (*)    Bilirubin Urine SMALL (*)    Ketones, ur 15 (*)    Protein, ur 30 (*)    All other components within normal limits  COMPREHENSIVE  METABOLIC PANEL WITH GFR - Abnormal; Notable for the following components:   ALT 86 (*)    All other components within normal limits  URINALYSIS, MICROSCOPIC (REFLEX) - Abnormal; Notable for the following components:   Bacteria, UA RARE (*)    All other components within normal limits  LIPASE, BLOOD  PREGNANCY, URINE  CBC WITH DIFFERENTIAL/PLATELET    EKG None  Radiology No results found.  Procedures Procedures    Medications Ordered in ED Medications  ondansetron  (ZOFRAN -ODT) disintegrating tablet 4 mg (4 mg Oral Given 12/24/23 1953)  dicyclomine (BENTYL) capsule 10 mg (10 mg Oral Given 12/24/23 2024)    ED Course/ Medical Decision Making/ A&P                                 Medical Decision Making Amount and/or Complexity of Data Reviewed Labs: ordered.   This patient presents to the ED for concern of abdominal pain with nausea, vomiting, diarrhea differential diagnosis includes diverticulitis, SBO, pancreatitis, appendicitis, viral GI illness, IBS, choledocholithiasis, acute cholecystitis  Additional history obtained:  Additional history obtained from gastroenterology office visit notes  Lab Tests:  I Ordered, and personally interpreted labs.  The pertinent results include: Elevated ALT at 86, no leukocytosis, small bilirubin, trace hemoglobin, 15 ketones, 30 protein, negative pregnancy   Medicines ordered and prescription drug management:  I ordered medication including Zofran  ODT for Nausea and Bentyl for abdominal cramping Reevaluation of the patient after these medicines showed that the patient improved I have reviewed the patients home medicines and have made adjustments as needed   Problem List / ED Course:  Given that the patient has no left lower quadrant abdominal pain and her previous CT showed distal descending and proximal sigmoid colon diverticulitis, I do not feel diverticulitis is likely at this time. Considered for admission or further workup  however patient's vital signs, physical exam, and labs were reassuring.  Patient able to tolerate p.o. intake prior to discharge.  Patient given short course of Zofran  outpatient for nausea and vomiting.  Patient instructed to continue taking her Bentyl as prescribed to her by GI.  Patient to follow-up with gastroenterology if her symptoms persist for further evaluation workup.          Final Clinical Impression(s) / ED Diagnoses Final diagnoses:  Left upper quadrant abdominal pain    Rx / DC Orders ED Discharge Orders          Ordered    ondansetron  (ZOFRAN -ODT) 4 MG disintegrating tablet  Every 8 hours PRN        12/24/23 2204  Carie Charity, PA-C 12/24/23 2210    Carin Charleston, MD 12/24/23 [phone]
